# Patient Record
Sex: Female | Born: 1970 | Race: White | Hispanic: No | Marital: Married | Smoking: Never smoker
Health system: Southern US, Community
[De-identification: ages and names within clinical notes are randomized; demographics above are authoritative.]

## PROBLEM LIST (undated history)

## (undated) DIAGNOSIS — I10 Essential (primary) hypertension: Secondary | ICD-10-CM

## (undated) DIAGNOSIS — E039 Hypothyroidism, unspecified: Secondary | ICD-10-CM

## (undated) DIAGNOSIS — Z973 Presence of spectacles and contact lenses: Secondary | ICD-10-CM

## (undated) DIAGNOSIS — F329 Major depressive disorder, single episode, unspecified: Secondary | ICD-10-CM

## (undated) DIAGNOSIS — R102 Pelvic and perineal pain: Secondary | ICD-10-CM

## (undated) DIAGNOSIS — J4520 Mild intermittent asthma, uncomplicated: Secondary | ICD-10-CM

## (undated) DIAGNOSIS — R112 Nausea with vomiting, unspecified: Secondary | ICD-10-CM

## (undated) DIAGNOSIS — K219 Gastro-esophageal reflux disease without esophagitis: Secondary | ICD-10-CM

## (undated) DIAGNOSIS — F32A Depression, unspecified: Secondary | ICD-10-CM

## (undated) DIAGNOSIS — R011 Cardiac murmur, unspecified: Secondary | ICD-10-CM

## (undated) DIAGNOSIS — Z9889 Other specified postprocedural states: Secondary | ICD-10-CM

## (undated) HISTORY — DX: Depression, unspecified: F32.A

## (undated) HISTORY — DX: Major depressive disorder, single episode, unspecified: F32.9

## (undated) HISTORY — PX: ABDOMINAL HYSTERECTOMY: SHX81

## (undated) HISTORY — PX: BACK SURGERY: SHX140

## (undated) HISTORY — PX: AUGMENTATION MAMMAPLASTY: SUR837

## (undated) HISTORY — PX: BREAST SURGERY: SHX581

---

## 1996-12-15 HISTORY — PX: TUBAL LIGATION: SHX77

## 1998-08-01 ENCOUNTER — Other Ambulatory Visit: Admission: RE | Admit: 1998-08-01 | Discharge: 1998-08-01 | Payer: Self-pay | Admitting: Obstetrics and Gynecology

## 1999-09-18 ENCOUNTER — Other Ambulatory Visit: Admission: RE | Admit: 1999-09-18 | Discharge: 1999-09-18 | Payer: Self-pay | Admitting: Obstetrics and Gynecology

## 2000-10-16 ENCOUNTER — Other Ambulatory Visit: Admission: RE | Admit: 2000-10-16 | Discharge: 2000-10-16 | Payer: Self-pay | Admitting: Obstetrics and Gynecology

## 2002-06-03 ENCOUNTER — Other Ambulatory Visit: Admission: RE | Admit: 2002-06-03 | Discharge: 2002-06-03 | Payer: Self-pay | Admitting: Obstetrics and Gynecology

## 2004-01-12 ENCOUNTER — Other Ambulatory Visit: Admission: RE | Admit: 2004-01-12 | Discharge: 2004-01-12 | Payer: Self-pay | Admitting: Obstetrics and Gynecology

## 2005-09-24 ENCOUNTER — Other Ambulatory Visit: Admission: RE | Admit: 2005-09-24 | Discharge: 2005-09-24 | Payer: Self-pay | Admitting: Obstetrics and Gynecology

## 2006-09-28 ENCOUNTER — Ambulatory Visit (HOSPITAL_COMMUNITY): Admission: RE | Admit: 2006-09-28 | Discharge: 2006-09-28 | Payer: Self-pay | Admitting: General Surgery

## 2006-09-28 ENCOUNTER — Encounter (INDEPENDENT_AMBULATORY_CARE_PROVIDER_SITE_OTHER): Payer: Self-pay | Admitting: Specialist

## 2006-09-28 HISTORY — PX: EXCISIONAL HEMORRHOIDECTOMY: SHX1541

## 2006-12-15 HISTORY — PX: BREAST ENHANCEMENT SURGERY: SHX7

## 2007-07-30 ENCOUNTER — Emergency Department: Payer: Self-pay | Admitting: Emergency Medicine

## 2007-12-16 HISTORY — PX: MICROTUBOPLASTY: SHX5401

## 2008-05-29 ENCOUNTER — Ambulatory Visit: Payer: Self-pay

## 2008-06-14 ENCOUNTER — Ambulatory Visit: Payer: Self-pay | Admitting: Pain Medicine

## 2008-12-05 ENCOUNTER — Ambulatory Visit (HOSPITAL_COMMUNITY): Admission: RE | Admit: 2008-12-05 | Discharge: 2008-12-06 | Payer: Self-pay | Admitting: Neurosurgery

## 2008-12-05 HISTORY — PX: LUMBAR LAMINECTOMY: SHX95

## 2009-02-26 ENCOUNTER — Ambulatory Visit (HOSPITAL_COMMUNITY): Admission: RE | Admit: 2009-02-26 | Discharge: 2009-02-26 | Payer: Self-pay | Admitting: Obstetrics and Gynecology

## 2009-04-25 ENCOUNTER — Inpatient Hospital Stay (HOSPITAL_COMMUNITY): Admission: AD | Admit: 2009-04-25 | Discharge: 2009-04-25 | Payer: Self-pay | Admitting: Obstetrics and Gynecology

## 2009-06-20 ENCOUNTER — Inpatient Hospital Stay (HOSPITAL_COMMUNITY): Admission: AD | Admit: 2009-06-20 | Discharge: 2009-06-20 | Payer: Self-pay | Admitting: Obstetrics and Gynecology

## 2010-02-11 ENCOUNTER — Encounter: Admission: RE | Admit: 2010-02-11 | Discharge: 2010-02-11 | Payer: Self-pay | Admitting: Obstetrics and Gynecology

## 2010-02-28 ENCOUNTER — Encounter: Admission: RE | Admit: 2010-02-28 | Discharge: 2010-02-28 | Payer: Self-pay | Admitting: Obstetrics and Gynecology

## 2010-03-08 ENCOUNTER — Encounter: Admission: RE | Admit: 2010-03-08 | Discharge: 2010-03-08 | Payer: Self-pay | Admitting: Obstetrics and Gynecology

## 2010-07-19 ENCOUNTER — Encounter: Admission: RE | Admit: 2010-07-19 | Discharge: 2010-07-19 | Payer: Self-pay | Admitting: Orthopedic Surgery

## 2011-01-06 ENCOUNTER — Encounter
Admission: RE | Admit: 2011-01-06 | Discharge: 2011-01-06 | Payer: Self-pay | Source: Home / Self Care | Attending: Obstetrics and Gynecology | Admitting: Obstetrics and Gynecology

## 2011-04-29 NOTE — Op Note (Signed)
NAMEAERON, Vanessa Chapman NO.:  [id number]   MEDICAL RECORD NO.:  [id number]          PATIENT TYPE:  OIB   LOCATION:  3534                         FACILITY:  [facility]   PHYSICIAN:  Hilda Lias, M.D.   DATE OF BIRTH:  Feb 14, 1971   DATE OF PROCEDURE:  12/05/2008  DATE OF DISCHARGE:                               OPERATIVE REPORT   PREOPERATIVE DIAGNOSIS:  Right L4-5 herniated disk with free fragment.   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   PROCEDURE:  Right L4 hemilaminectomy, right L4-5 foraminotomy,  decompression of the L4-L5 nerve root, microscopy.   SURGEON:  Hilda Lias, MD   ASSISTANT:  Danae Orleans. Venetia Maxon, MD   CLINICAL HISTORY:  [name] is a 40 year old female who about 3 years  ago had sudden onset of back pain.  Back in May 2009, she developed pain  down to the right hip.  The patient had quite a bit of discomfort and  she has failed conservative treatment.  MRI done in June 2009 showed  fragment of L4-5.  The patient wants to proceed with surgery.  Risk was  explained to her.   PROCEDURE:  The patient was taken to the OR and after intubation, she  was positioned in the prone manner.  The first x-rays showed the lamina  was in the level of L5-S1.  From then on, after we cleaned the skin with  DuraPrep, we made an incision from L4-L5.  We dissected the mass in the  right side.  Second x-ray was obtained and we proceed with a laminotomy  at L4-5.  The patient had quite a bit vertical canal.  With the  microscope, we removed a yellow ligament.  We investigated the thecal  sac and we went laterally to investigate the L4 and then later on the L5  nerve root.  The L4-5 disk was quite flat.  There was no evidence of any  herniated disk.  We did a foraminotomy because of stenosis of L5 nerve  root and after that we were able to retract the takeoff of the L5 nerve  root.  The patient had a small herniated disk which was flat, but  mostly  was adherent to the L5 nerve root.  With the microcurette, we did the  lysis of adhesions and we tried to open the area where the adhesion was,  but there was no evidence of any fragment.  Because the disk was  completely normal, we did not proceed with any diskectomy.  Then, we  investigated the L4 nerve root canal and it was wide open and  foraminotomy was accomplished.  At the end, we had plenty of room.  Valsalva maneuver was negative.  Then, the area was irrigated.  Fentanyl  and Depo-Medrol were left in the epidural space and the wound was closed  with Vicryl and Steri-Strips.           ______________________________  Hilda Lias, M.D.     EB/MEDQ  D:  12/05/2008  T:  12/05/2008  Job:  [id number]

## 2011-05-02 NOTE — Op Note (Signed)
NAME:  Vanessa Chapman, Vanessa Chapman                  ACCOUNT NO.:  [id number]   MEDICAL RECORD NO.:  [id number]          PATIENT TYPE:  AMB   LOCATION:  DAY                          FACILITY:  [facility]   PHYSICIAN:  Ollen Gross. Vernell Morgans, M.D. DATE OF BIRTH:  10-25-1971   DATE OF PROCEDURE:  09/28/2006  DATE OF DISCHARGE:                               OPERATIVE REPORT   PREOPERATIVE DIAGNOSIS:  External hemorrhoids.   POSTOPERATIVE DIAGNOSIS:  External hemorrhoids.   PROCEDURE:  External hemorrhoidectomy x2.   SURGEON:  Ollen Gross. Carolynne Edouard, M.D.   ANESTHESIA:  General via LMA.   PROCEDURE:  After informed consent was obtained, the patient was brought  to the operating and placed in the supine position on the operating  table.  After adequate induction of general anesthesia, the patient was  placed in lithotomy position.  Her perirectal area was prepped with  Betadine and draped in the usual sterile manner.  The patient had a  anterior external hemorrhoid and a right posterior external hemorrhoid  that were chronically thrombosing and causing her significant problems.  She had small hemorrhoidal  extra skin in each of these areas.  The  perirectal region was then infiltrated with 9 cc of 0.25% Marcaine with  1 cc Wydase, and the tissue was massaged gently for several minutes.  The perirectal region was then infiltrated with 10 more cc of 0.25%  Marcaine with epinephrine.  Again, the tissue was massaged for several  minutes.  A small bullet retractor was then placed inside the rectum.  Other than the two areas of external hemorrhoidal skin, there were no  other abnormalities noted.  Each of these areas was grasped with a Allis  clamp and elevated. The base of the extra hemorrhoidal skin was then  incised with a 15 blade knife.  A hemostat was then placed across the  base of this hemorrhoidal skin and clamped. The external hemorrhoid was  then removed with Metzenbaum scissors, and the incision was then  reapproximated with a running 3-0 chromic stitch.  The last couple  millimeters of each of these incisions was left open for drainage.  Once  this was accomplished, each of the incisions was found to be hemostatic  and well approximated.  A small piece of Gelfoam with dibucaine ointment  on it was placed inside the rectum, and dibucaine ointment was applied  externally to the incisions and open wounds.  Sterile dressings were  then applied.  The patient tolerated the procedure well.  At the end of  the case, all needle, sponge, and instrument counts were correct.  The  patient was then awakened and taken to the recovery room in stable  condition.      Ollen Gross. Vernell Morgans, M.D.  Electronically Signed     PST/MEDQ  D:  09/28/2006  T:  09/28/2006  Job:  [id number]

## 2011-09-19 LAB — CBC
HCT: 39.5 % (ref 36.0–46.0)
Platelets: 250 10*3/uL (ref 150–400)
RBC: 4.19 MIL/uL (ref 3.87–5.11)
RDW: 13 % (ref 11.5–15.5)

## 2011-12-05 ENCOUNTER — Other Ambulatory Visit: Payer: Self-pay | Admitting: Obstetrics and Gynecology

## 2011-12-05 DIAGNOSIS — Z1231 Encounter for screening mammogram for malignant neoplasm of breast: Secondary | ICD-10-CM

## 2011-12-24 ENCOUNTER — Other Ambulatory Visit: Payer: Self-pay | Admitting: Obstetrics and Gynecology

## 2011-12-24 ENCOUNTER — Ambulatory Visit
Admission: RE | Admit: 2011-12-24 | Discharge: 2011-12-24 | Disposition: A | Discharge status: Home / Self Care | Payer: Self-pay | Source: Ambulatory Visit | Attending: Obstetrics and Gynecology | Admitting: Obstetrics and Gynecology

## 2011-12-24 DIAGNOSIS — Z1231 Encounter for screening mammogram for malignant neoplasm of breast: Secondary | ICD-10-CM

## 2013-09-13 ENCOUNTER — Other Ambulatory Visit: Payer: Self-pay | Admitting: Internal Medicine

## 2013-09-13 DIAGNOSIS — R1013 Epigastric pain: Secondary | ICD-10-CM

## 2013-09-16 ENCOUNTER — Ambulatory Visit: Payer: Self-pay | Admitting: Internal Medicine

## 2013-09-16 LAB — HCG, QUANTITATIVE, PREGNANCY: Beta Hcg, Quant.: <1 m[IU]/mL — ABNORMAL LOW

## 2013-09-23 ENCOUNTER — Other Ambulatory Visit: Payer: BC Managed Care – PPO

## 2013-09-26 ENCOUNTER — Ambulatory Visit
Admission: RE | Admit: 2013-09-26 | Discharge: 2013-09-26 | Disposition: A | Discharge status: Home / Self Care | Payer: BC Managed Care – PPO | Source: Ambulatory Visit | Attending: Internal Medicine | Admitting: Internal Medicine

## 2013-09-26 DIAGNOSIS — R1013 Epigastric pain: Secondary | ICD-10-CM

## 2013-10-12 ENCOUNTER — Ambulatory Visit: Payer: Self-pay | Admitting: Internal Medicine

## 2013-10-20 ENCOUNTER — Ambulatory Visit: Payer: Self-pay | Admitting: Internal Medicine

## 2014-01-26 ENCOUNTER — Ambulatory Visit: Payer: Self-pay | Admitting: Gastroenterology

## 2014-02-14 ENCOUNTER — Other Ambulatory Visit: Payer: Self-pay

## 2014-02-14 DIAGNOSIS — Z1231 Encounter for screening mammogram for malignant neoplasm of breast: Secondary | ICD-10-CM

## 2014-03-09 ENCOUNTER — Ambulatory Visit
Admission: RE | Admit: 2014-03-09 | Discharge: 2014-03-09 | Disposition: A | Discharge status: Home / Self Care | Payer: Self-pay | Source: Ambulatory Visit

## 2014-03-09 DIAGNOSIS — Z1231 Encounter for screening mammogram for malignant neoplasm of breast: Secondary | ICD-10-CM

## 2015-01-04 ENCOUNTER — Ambulatory Visit: Payer: Self-pay | Admitting: Endocrinology

## 2015-01-10 ENCOUNTER — Other Ambulatory Visit: Payer: Self-pay | Admitting: Obstetrics and Gynecology

## 2015-01-11 LAB — CYTOLOGY - PAP

## 2015-01-15 HISTORY — PX: ENDOMETRIAL ABLATION W/ NOVASURE: SUR434

## 2015-03-06 ENCOUNTER — Other Ambulatory Visit: Payer: Self-pay | Admitting: Obstetrics and Gynecology

## 2015-06-02 ENCOUNTER — Other Ambulatory Visit: Payer: Self-pay | Admitting: Urgent Care

## 2015-06-02 DIAGNOSIS — K219 Gastro-esophageal reflux disease without esophagitis: Secondary | ICD-10-CM

## 2015-10-16 HISTORY — PX: BALLOON SINUPLASTY: SHX5740

## 2015-11-01 ENCOUNTER — Emergency Department
Admission: EM | Admit: 2015-11-01 | Discharge: 2015-11-02 | Disposition: A | Discharge status: Home / Self Care | Payer: BLUE CROSS/BLUE SHIELD | Attending: Student | Admitting: Student

## 2015-11-01 ENCOUNTER — Other Ambulatory Visit: Payer: Self-pay

## 2015-11-01 DIAGNOSIS — Z3202 Encounter for pregnancy test, result negative: Secondary | ICD-10-CM | POA: Insufficient documentation

## 2015-11-01 DIAGNOSIS — R112 Nausea with vomiting, unspecified: Secondary | ICD-10-CM | POA: Insufficient documentation

## 2015-11-01 DIAGNOSIS — R001 Bradycardia, unspecified: Secondary | ICD-10-CM | POA: Insufficient documentation

## 2015-11-01 DIAGNOSIS — Z880 Allergy status to penicillin: Secondary | ICD-10-CM | POA: Insufficient documentation

## 2015-11-01 DIAGNOSIS — G8918 Other acute postprocedural pain: Secondary | ICD-10-CM | POA: Diagnosis present

## 2015-11-01 DIAGNOSIS — Z79899 Other long term (current) drug therapy: Secondary | ICD-10-CM | POA: Insufficient documentation

## 2015-11-01 DIAGNOSIS — R1013 Epigastric pain: Secondary | ICD-10-CM | POA: Diagnosis not present

## 2015-11-01 LAB — COMPREHENSIVE METABOLIC PANEL
ALT: 12 U/L — ABNORMAL LOW (ref 14–54)
AST: 17 U/L (ref 15–41)
Albumin: 4.6 g/dL (ref 3.5–5.0)
Alkaline Phosphatase: 52 U/L (ref 38–126)
Anion gap: 5 (ref 5–15)
BILIRUBIN TOTAL: 0.6 mg/dL (ref 0.3–1.2)
BUN: 14 mg/dL (ref 6–20)
CHLORIDE: 105 mmol/L (ref 101–111)
CO2: 32 mmol/L (ref 22–32)
CREATININE: 0.83 mg/dL (ref 0.44–1.00)
Calcium: 9.2 mg/dL (ref 8.9–10.3)
Glucose, Bld: 115 mg/dL — ABNORMAL HIGH (ref 65–99)
POTASSIUM: 4 mmol/L (ref 3.5–5.1)
Sodium: 142 mmol/L (ref 135–145)
TOTAL PROTEIN: 7.3 g/dL (ref 6.5–8.1)

## 2015-11-01 LAB — URINALYSIS COMPLETE WITH MICROSCOPIC (ARMC ONLY)
BILIRUBIN URINE: NEGATIVE
Glucose, UA: NEGATIVE mg/dL
Hgb urine dipstick: NEGATIVE
KETONES UR: NEGATIVE mg/dL
Leukocytes, UA: NEGATIVE
NITRITE: NEGATIVE
Protein, ur: NEGATIVE mg/dL
SPECIFIC GRAVITY, URINE: 1.01 (ref 1.005–1.030)
pH: 7 (ref 5.0–8.0)

## 2015-11-01 LAB — PREGNANCY, URINE: PREG TEST UR: NEGATIVE

## 2015-11-01 LAB — CBC
HEMATOCRIT: 38.8 % (ref 35.0–47.0)
Hemoglobin: 12.5 g/dL (ref 12.0–16.0)
MCH: 29.6 pg (ref 26.0–34.0)
MCHC: 32.3 g/dL (ref 32.0–36.0)
MCV: 91.6 fL (ref 80.0–100.0)
PLATELETS: 276 10*3/uL (ref 150–440)
RBC: 4.24 MIL/uL (ref 3.80–5.20)
RDW: 13.9 % (ref 11.5–14.5)
WBC: 16 10*3/uL — AB (ref 3.6–11.0)

## 2015-11-01 LAB — LIPASE, BLOOD: Lipase: 34 U/L (ref 11–51)

## 2015-11-01 MED ORDER — SODIUM CHLORIDE 0.9 % IV BOLUS (SEPSIS)
1000.0000 mL | Freq: Once | INTRAVENOUS | Status: AC
  Administered 2015-11-01: 1000 mL via INTRAVENOUS

## 2015-11-01 MED ORDER — FAMOTIDINE IN NACL 20-0.9 MG/50ML-% IV SOLN
20.0000 mg | Freq: Once | INTRAVENOUS | Status: AC
  Administered 2015-11-01: 20 mg via INTRAVENOUS
  Filled 2015-11-01: qty 50

## 2015-11-01 MED ORDER — OMEPRAZOLE MAGNESIUM 20 MG PO TBEC: 20.0000 mg | DELAYED_RELEASE_TABLET | Freq: Every day | ORAL | Status: DC

## 2015-11-01 MED ORDER — OXYCODONE HCL 5 MG PO TABS: 5.0000 mg | ORAL_TABLET | Freq: Four times a day (QID) | ORAL | Status: DC | PRN

## 2015-11-01 MED ORDER — ONDANSETRON HCL 4 MG/2ML IJ SOLN
4.0000 mg | Freq: Once | INTRAMUSCULAR | Status: AC
  Administered 2015-11-01: 4 mg via INTRAVENOUS
  Filled 2015-11-01: qty 2

## 2015-11-01 MED ORDER — OXYCODONE HCL 5 MG PO TABS
10.0000 mg | ORAL_TABLET | Freq: Once | ORAL | Status: AC
  Administered 2015-11-01: 10 mg via ORAL
  Filled 2015-11-01: qty 2

## 2015-11-01 NOTE — ED Provider Notes (Signed)
[facility] Emergency Department Provider Note  ____________________________________________  Time seen: Approximately 9:21 PM  I have reviewed the triage vital signs and the nursing notes.   HISTORY  Chief Complaint Post-op Problem    HPI Vanessa SchwalbeKaren Mascaro is a 44 y.o. female POD#1 s/p sinuplasty yesterday formed by Dr. Colbert EwingYuengel presents for evaluation of one day for gastric soreness and bilious emesis, gradual onset, constant since onset, currently moderate to severe. The patient reports that she was prescribed Norco which contains acetaminophen and she has had upset stomach in the setting of taking acetaminophen in the past. Denies Any fevers or chills. No diarrhea. No chest pain or difficulty breathing. Reports she is healing well from her surgery.   No past medical history on file.  There are no active problems to display for this patient.   No past surgical history on file.  Current Outpatient Rx  Name  Route  Sig  Dispense  Refill  . DEXILANT 60 MG capsule      take 1 capsule by mouth once daily   30 capsule   11     SAVINGS FOR NON-COVERED DRUGS -- [id number], PCN:  ...   . omeprazole (PRILOSEC OTC) 20 MG tablet   Oral   Take 1 tablet (20 mg total) by mouth daily.   30 tablet   0   . oxyCODONE (ROXICODONE) 5 MG immediate release tablet   Oral   Take 1 tablet (5 mg total) by mouth every 6 (six) hours as needed for moderate pain (Do not take norco while taking this medication). Do not drive while taking this medication.   12 tablet   0     Allergies Penicillins  No family history on file.  Social History Social History  Substance Use Topics  . Smoking status: Not on file  . Smokeless tobacco: Not on file  . Alcohol Use: Not on file    Review of Systems Constitutional: No fever/chills Eyes: No visual changes. ENT: No sore throat. Cardiovascular: Denies chest pain. Respiratory: Denies shortness of breath. Gastrointestinal: +  abdominal pain.  + nausea, +vomiting.  No diarrhea.  No constipation. Genitourinary: Negative for dysuria. Musculoskeletal: Negative for back pain. Skin: Negative for rash. Neurological: Negative for headaches, focal weakness or numbness.  10-point ROS otherwise negative.  ____________________________________________   PHYSICAL EXAM:  VITAL SIGNS: ED Triage Vitals  Enc Vitals Group     BP 11/01/15 1955 163/92 mmHg     Pulse Rate 11/01/15 1953 60     Resp 11/01/15 1953 18     Temp 11/01/15 1953 98.1 F (36.7 C)     Temp Source 11/01/15 1953 Oral     SpO2 11/01/15 1953 100 %     Weight 11/01/15 1953 146 lb (66.225 kg)     Height 11/01/15 1953 5\' 3"  (1.6 m)     Head Cir --      Peak Flow --      Pain Score 11/01/15 1954 9     Pain Loc --      Pain Edu? --      Excl. in GC? --     Constitutional: Alert and oriented. Well appearing and in no acute distress. Eyes: Conjunctivae are normal. PERRL. EOMI. Head: Atraumatic. Nose: No congestion/rhinnorhea. Mouth/Throat: Mucous membranes are moist.  Oropharynx non-erythematous. Neck: No stridor.  Cardiovascular: mildly bradycardic rate, regular rhythm. Grossly normal heart sounds.  Good peripheral circulation. Respiratory: Normal respiratory effort.  No retractions. Lungs CTAB. Gastrointestinal: Soft  and nontender. No distention.  No CVA tenderness. Genitourinary: deferred Musculoskeletal: No lower extremity tenderness nor edema.  No joint effusions. Neurologic:  Normal speech and language. No gross focal neurologic deficits are appreciated. No gait instability. Skin:  Skin is warm, dry and intact. No rash noted. Psychiatric: Mood and affect are normal. Speech and behavior are normal.  ____________________________________________   LABS (all labs ordered are listed, but only abnormal results are displayed)  Labs Reviewed  CBC - Abnormal; Notable for the following:    WBC 16.0 (*)    All other components within normal limits   COMPREHENSIVE METABOLIC PANEL - Abnormal; Notable for the following:    Glucose, Bld 115 (*)    ALT 12 (*)    All other components within normal limits  URINALYSIS COMPLETEWITH MICROSCOPIC ([facility] ONLY) - Abnormal; Notable for the following:    Color, Urine YELLOW (*)    APPearance CLEAR (*)    Bacteria, UA RARE (*)    Squamous Epithelial / LPF 6-30 (*)    All other components within normal limits  LIPASE, BLOOD  PREGNANCY, URINE  URINALYSIS COMPLETEWITH MICROSCOPIC ([facility] ONLY)   ____________________________________________  EKG  ED ECG REPORT I, Gayla Doss, the attending physician, personally viewed and interpreted this ECG.   Date: 11/01/2015  EKG Time: 23:36  Rate: 48  Rhythm: sinus bradycardia  Axis: normal  Intervals:none  ST&T Change: No acute ST elevation  ____________________________________________  RADIOLOGY  none ____________________________________________   PROCEDURES  Procedure(s) performed: None  Critical Care performed: No  ____________________________________________   INITIAL IMPRESSION / ASSESSMENT AND PLAN / ED COURSE  Pertinent labs & imaging results that were available during my care of the patient were reviewed by me and considered in my medical decision making (see chart for details).  Vanessa Chapman is a 44 y.o. female POD#1 s/p sinuplasty yesterday formed by Dr. Elenore Rota presents for evaluation of one day for gastric soreness and bilious emesis, gradual onset, constant since onset, currently moderate to severe. On exam, she is nontoxic appearing and in no acute distress. Vital signs stable, she is afebrile. She has a completely benign abdominal exam without tenderness. Normal bowel sounds. Passing Flatus. Labs reviewed are notable for leukocytosis which is likely catecholamine- induced in the setting of vomiting. Normal CMP, normal lipase negative pregnancy test, urinalysis not consistent with infection. Given her benign exam, the fact  that she is tolerating by mouth intake here in the emergency department, I do not think she needs advanced imaging at this time. She feels better after Zofran and IV fluids. Discussed that we would discharge her with Roxicodone which does not contain acetaminophen and Pepcid that will control her pain and not upset her stomach. Case discussed with Dr. Elenore Rota as well. DC with return precautions. ____________________________________________   FINAL CLINICAL IMPRESSION(S) / ED DIAGNOSES  Final diagnoses:  Epigastric pain  Non-intractable vomiting with nausea, vomiting of unspecified type      Gayla Doss, MD 11/01/15 2350

## 2015-11-01 NOTE — ED Notes (Signed)
Pt in with co vomiting and abd pain had sinus surgery yest.  Has been taking norco since yest, not able to keep food or fluids down.

## 2016-03-12 ENCOUNTER — Telehealth: Payer: Self-pay | Admitting: Gastroenterology

## 2016-03-12 NOTE — Telephone Encounter (Signed)
Left pt a message stating unfortunately I do not have an earlier appt but after review her previous notes from 2015 she will be okay to wait until that time. Advised her if symptoms worsen, she can contact her primary care and explain the situation. I have also added her to our wait list in case someone cancels.

## 2016-03-12 NOTE — Telephone Encounter (Signed)
Vanessa Chapman, this is a patient of Dr Servando SnareWohl. She last saw him in 2015. Her symptoms have returned. She is having constant abdominal pain. Dexilant doesn't help. I have scheduled her an appointment for May 4th. She would like to speak with you to make sure its okay to wait that long. Please call and advise. Thank you.

## 2016-04-17 ENCOUNTER — Ambulatory Visit: Payer: Self-pay | Admitting: Gastroenterology

## 2016-10-29 NOTE — H&P (Signed)
Vanessa SchwalbeKaren Chapman  DICTATION # [id number] CSN# [id number]   Vanessa PicaHOLLAND,Vanessa Beedy M, MD 10/29/2016 10:45 AM

## 2016-10-29 NOTE — H&P (Signed)
NAMRennis Petty:  Vanessa Chapman, Vanessa Chapman                ACCOUNT NO.:  [id number]  MEDICAL RECORD NO.:  [id number]  LOCATION:                                 FACILITY:  PHYSICIAN:  Duke Salviaichard M. Marcelle OverlieHolland, M.D.DATE OF BIRTH:  03-11-1971  DATE OF ADMISSION: DATE OF DISCHARGE:                             HISTORY & PHYSICAL   CHIEF COMPLAINT:  Pelvic pain, adenomyosis.  HISTORY OF PRESENT ILLNESS:  A 45 year old, G2, P2, who has had a prior endometrial ablation that was in April of 2016.  Her bleeding has done generally well since that time, but she continues to have significant pelvic pain, not relieved by OTC medications.  Ultrasound evaluation August 17 showed a small intramural fibroid, areas that looked cystic within the myometrium consistent with adenomyosis.  Adnexa negative. She presents at this time for LAVH and bilateral salpingectomy.  This procedure including specific risks related to bleeding, infection, transfusion, adjacent organ injury, wound infection, phlebitis, her expected recovery time, and the possible need to complete the surgery by open technique, all reviewed with her which she understands and accepts.  PAST MEDICAL HISTORY:  ALLERGIES: 1. PENICILLIN. 2. SINGULAIR.  CURRENT MEDICATIONS:  Synthroid, metoprolol, Xanax p.r.n., Vicodin as needed for pain.  SURGICAL HISTORY:  She has had 1 vaginal delivery, 1 C-section, tubal reversal in 2009, breast augmentation in 2008, hemorrhoidectomy and NovaSure ablation in February, 2016.  FAMILY HISTORY:  Otherwise negative.  SOCIAL HISTORY:  Denies drug or tobacco use.  Drinks alcohol socially. She is married.  Dr. Sharin GraveSam Morayati is her PCP.  Last Pap on September 17 was normal.  PHYSICAL EXAMINATION:  VITAL SIGNS:  Temp 98.2, blood pressure 134/88. HEENT:  Unremarkable. NECK:  Supple without masses. LUNGS:  Clear. CARDIOVASCULAR:  Regular rate and rhythm without murmurs, rubs, or gallops noted. BREASTS:  Revealed implants.  No  masses. ABDOMEN:  Soft, flat, nontender.  Vulva, vagina, cervix normal.  Uterus upper limits of normal size, mobile.  Adnexa negative. EXTREMITIES:  Unremarkable. NEUROLOGIC:  Unremarkable.  IMPRESSION:  Pelvic pain, probably secondary to adenomyosis, prior ablation.  PLAN:  LAVH and bilateral salpingectomy.  Procedure and risks reviewed as above.     Samira Acero M. Marcelle OverlieHolland, M.D.     [facility]/MEDQ  D:  10/29/2016  T:  10/29/2016  Job:  [id number]

## 2016-11-13 ENCOUNTER — Encounter (HOSPITAL_BASED_OUTPATIENT_CLINIC_OR_DEPARTMENT_OTHER): Payer: Self-pay | Admitting: *Deleted

## 2016-11-14 ENCOUNTER — Encounter (HOSPITAL_BASED_OUTPATIENT_CLINIC_OR_DEPARTMENT_OTHER): Payer: Self-pay | Admitting: *Deleted

## 2016-11-14 NOTE — Progress Notes (Signed)
NPO AFTER MN.  ARRIVE AT 0600.  NEEDS URINE PREG. AND T&S.  GETTING LAB WORK DONE Monday 11-17-2016 (CBC, BMET).  CURRENT EKG IN CHART AND EPIC.  WILL TAKE AM MEDS AND ACID REDUCER DOS W/ SIPS OF WATER.

## 2016-11-17 LAB — CBC
HCT: 40.5 % (ref 36.0–46.0)
Hemoglobin: 13.4 g/dL (ref 12.0–15.0)
MCH: 31.8 pg (ref 26.0–34.0)
MCHC: 33.1 g/dL (ref 30.0–36.0)
MCV: 96 fL (ref 78.0–100.0)
PLATELETS: 239 10*3/uL (ref 150–400)
RBC: 4.22 MIL/uL (ref 3.87–5.11)
RDW: 12.8 % (ref 11.5–15.5)
WBC: 6.1 10*3/uL (ref 4.0–10.5)

## 2016-11-17 LAB — TYPE AND SCREEN
ABO/RH(D): A POS
Antibody Screen: NEGATIVE

## 2016-11-17 LAB — ABO/RH: ABO/RH(D): A POS

## 2016-11-18 ENCOUNTER — Inpatient Hospital Stay (HOSPITAL_BASED_OUTPATIENT_CLINIC_OR_DEPARTMENT_OTHER)
Admission: AD | Admit: 2016-11-18 | Discharge: 2016-11-20 | DRG: 743 | Disposition: A | Discharge status: Home / Self Care | Payer: BLUE CROSS/BLUE SHIELD | Source: Ambulatory Visit | Attending: Obstetrics and Gynecology | Admitting: Obstetrics and Gynecology

## 2016-11-18 ENCOUNTER — Ambulatory Visit (HOSPITAL_BASED_OUTPATIENT_CLINIC_OR_DEPARTMENT_OTHER): Payer: BLUE CROSS/BLUE SHIELD | Admitting: Anesthesiology

## 2016-11-18 ENCOUNTER — Encounter (HOSPITAL_COMMUNITY)
Admission: AD | Disposition: A | Discharge status: Home / Self Care | Payer: Self-pay | Source: Ambulatory Visit | Attending: Obstetrics and Gynecology

## 2016-11-18 ENCOUNTER — Encounter (HOSPITAL_BASED_OUTPATIENT_CLINIC_OR_DEPARTMENT_OTHER): Payer: Self-pay | Admitting: *Deleted

## 2016-11-18 DIAGNOSIS — N800 Endometriosis of uterus: Secondary | ICD-10-CM | POA: Diagnosis present

## 2016-11-18 DIAGNOSIS — N856 Intrauterine synechiae: Secondary | ICD-10-CM | POA: Diagnosis present

## 2016-11-18 DIAGNOSIS — E039 Hypothyroidism, unspecified: Secondary | ICD-10-CM | POA: Diagnosis present

## 2016-11-18 DIAGNOSIS — R102 Pelvic and perineal pain: Secondary | ICD-10-CM | POA: Diagnosis present

## 2016-11-18 DIAGNOSIS — Z419 Encounter for procedure for purposes other than remedying health state, unspecified: Secondary | ICD-10-CM

## 2016-11-18 HISTORY — DX: Hypothyroidism, unspecified: E03.9

## 2016-11-18 HISTORY — DX: Mild intermittent asthma, uncomplicated: J45.20

## 2016-11-18 HISTORY — DX: Nausea with vomiting, unspecified: R11.2

## 2016-11-18 HISTORY — DX: Pelvic and perineal pain: R10.2

## 2016-11-18 HISTORY — DX: Nausea with vomiting, unspecified: Z98.890

## 2016-11-18 HISTORY — DX: Presence of spectacles and contact lenses: Z97.3

## 2016-11-18 HISTORY — DX: Gastro-esophageal reflux disease without esophagitis: K21.9

## 2016-11-18 HISTORY — DX: Essential (primary) hypertension: I10

## 2016-11-18 HISTORY — PX: LAPAROSCOPIC VAGINAL HYSTERECTOMY WITH SALPINGECTOMY: SHX6680

## 2016-11-18 HISTORY — DX: Cardiac murmur, unspecified: R01.1

## 2016-11-18 LAB — POCT I-STAT, CHEM 8
BUN: 13 mg/dL (ref 6–20)
CALCIUM ION: 1.22 mmol/L (ref 1.15–1.40)
CREATININE: 0.9 mg/dL (ref 0.44–1.00)
Chloride: 102 mmol/L (ref 101–111)
GLUCOSE: 95 mg/dL (ref 65–99)
HCT: 40 % (ref 36.0–46.0)
Hemoglobin: 13.6 g/dL (ref 12.0–15.0)
Potassium: 3.7 mmol/L (ref 3.5–5.1)
Sodium: 143 mmol/L (ref 135–145)
TCO2: 27 mmol/L (ref 0–100)

## 2016-11-18 SURGERY — HYSTERECTOMY, VAGINAL, LAPAROSCOPY-ASSISTED, WITH SALPINGECTOMY
Anesthesia: General | Laterality: Bilateral

## 2016-11-18 MED ORDER — ONDANSETRON HCL 4 MG/2ML IJ SOLN: 4.0000 mg | Freq: Four times a day (QID) | INTRAMUSCULAR | Status: DC | PRN

## 2016-11-18 MED ORDER — MORPHINE SULFATE 2 MG/ML IV SOLN
INTRAVENOUS | Status: DC
  Administered 2016-11-18: 14:00:00 via INTRAVENOUS
  Administered 2016-11-18: 4 mg via INTRAVENOUS
  Administered 2016-11-19: 10 mg via INTRAVENOUS
  Filled 2016-11-18: qty 25

## 2016-11-18 MED ORDER — ONDANSETRON HCL 4 MG PO TABS: 4.0000 mg | ORAL_TABLET | Freq: Four times a day (QID) | ORAL | Status: DC | PRN

## 2016-11-18 MED ORDER — FENTANYL CITRATE (PF) 100 MCG/2ML IJ SOLN
INTRAMUSCULAR | Status: DC | PRN
  Administered 2016-11-18: 50 ug via INTRAVENOUS
  Administered 2016-11-18 (×2): 25 ug via INTRAVENOUS

## 2016-11-18 MED ORDER — DIPHENHYDRAMINE HCL 12.5 MG/5ML PO ELIX: 12.5000 mg | ORAL_SOLUTION | Freq: Four times a day (QID) | ORAL | Status: DC | PRN

## 2016-11-18 MED ORDER — ONDANSETRON HCL 4 MG/2ML IJ SOLN
INTRAMUSCULAR | Status: AC
  Filled 2016-11-18: qty 2

## 2016-11-18 MED ORDER — PROPOFOL 10 MG/ML IV BOLUS
INTRAVENOUS | Status: AC
  Filled 2016-11-18: qty 20

## 2016-11-18 MED ORDER — GLYCOPYRROLATE 0.2 MG/ML IV SOSY
PREFILLED_SYRINGE | INTRAVENOUS | Status: DC | PRN
  Administered 2016-11-18: .2 mg via INTRAVENOUS

## 2016-11-18 MED ORDER — PROPOFOL 10 MG/ML IV BOLUS
INTRAVENOUS | Status: DC | PRN
  Administered 2016-11-18: 200 mg via INTRAVENOUS

## 2016-11-18 MED ORDER — ESTRADIOL 0.1 MG/GM VA CREA
TOPICAL_CREAM | VAGINAL | Status: AC
  Filled 2016-11-18: qty 42.5

## 2016-11-18 MED ORDER — FENTANYL CITRATE (PF) 100 MCG/2ML IJ SOLN
INTRAMUSCULAR | Status: AC
Start: 2016-11-18 — End: 2016-11-18
  Filled 2016-11-18: qty 2

## 2016-11-18 MED ORDER — SCOPOLAMINE 1 MG/3DAYS TD PT72
MEDICATED_PATCH | TRANSDERMAL | Status: AC
  Filled 2016-11-18: qty 1

## 2016-11-18 MED ORDER — OXYCODONE HCL 5 MG/5ML PO SOLN
5.0000 mg | Freq: Once | ORAL | Status: DC | PRN
  Filled 2016-11-18: qty 5

## 2016-11-18 MED ORDER — BUTORPHANOL TARTRATE 2 MG/ML IJ SOLN
1.0000 mg | INTRAMUSCULAR | Status: DC | PRN
  Administered 2016-11-18: 2 mg via INTRAVENOUS
  Filled 2016-11-18: qty 1

## 2016-11-18 MED ORDER — GLYCOPYRROLATE 0.2 MG/ML IV SOSY
PREFILLED_SYRINGE | INTRAVENOUS | Status: AC
  Filled 2016-11-18: qty 3

## 2016-11-18 MED ORDER — MIDAZOLAM HCL 2 MG/2ML IJ SOLN
INTRAMUSCULAR | Status: AC
  Filled 2016-11-18: qty 2

## 2016-11-18 MED ORDER — SODIUM CHLORIDE 0.9 % IJ SOLN
INTRAMUSCULAR | Status: AC
  Filled 2016-11-18: qty 50

## 2016-11-18 MED ORDER — HYDROMORPHONE HCL 1 MG/ML IJ SOLN
0.2500 mg | INTRAMUSCULAR | Status: DC | PRN
  Administered 2016-11-18 (×2): 0.5 mg via INTRAVENOUS
  Filled 2016-11-18: qty 0.5

## 2016-11-18 MED ORDER — NALOXONE HCL 0.4 MG/ML IJ SOLN: 0.4000 mg | INTRAMUSCULAR | Status: DC | PRN

## 2016-11-18 MED ORDER — BUPIVACAINE LIPOSOME 1.3 % IJ SUSP
INTRAMUSCULAR | Status: AC
  Filled 2016-11-18: qty 20

## 2016-11-18 MED ORDER — HYDROMORPHONE HCL 1 MG/ML IJ SOLN
INTRAMUSCULAR | Status: AC
  Filled 2016-11-18: qty 0.5

## 2016-11-18 MED ORDER — LACTATED RINGERS IV SOLN
INTRAVENOUS | Status: DC
  Administered 2016-11-18 (×2): via INTRAVENOUS
  Filled 2016-11-18: qty 1000

## 2016-11-18 MED ORDER — DIPHENHYDRAMINE HCL 50 MG/ML IJ SOLN: 12.5000 mg | Freq: Four times a day (QID) | INTRAMUSCULAR | Status: DC | PRN

## 2016-11-18 MED ORDER — DEXAMETHASONE SODIUM PHOSPHATE 4 MG/ML IJ SOLN
INTRAMUSCULAR | Status: DC | PRN
  Administered 2016-11-18: 10 mg via INTRAVENOUS

## 2016-11-18 MED ORDER — MIDAZOLAM HCL 5 MG/5ML IJ SOLN
INTRAMUSCULAR | Status: DC | PRN
  Administered 2016-11-18: 2 mg via INTRAVENOUS

## 2016-11-18 MED ORDER — DEXAMETHASONE SODIUM PHOSPHATE 10 MG/ML IJ SOLN
INTRAMUSCULAR | Status: AC
  Filled 2016-11-18: qty 1

## 2016-11-18 MED ORDER — DEXTROSE IN LACTATED RINGERS 5 % IV SOLN
INTRAVENOUS | Status: DC
  Administered 2016-11-18 – 2016-11-19 (×3): via INTRAVENOUS

## 2016-11-18 MED ORDER — 0.9 % SODIUM CHLORIDE (POUR BTL) OPTIME
TOPICAL | Status: DC | PRN
  Administered 2016-11-18: 1000 mL

## 2016-11-18 MED ORDER — ROCURONIUM BROMIDE 50 MG/5ML IV SOSY
PREFILLED_SYRINGE | INTRAVENOUS | Status: DC | PRN
  Administered 2016-11-18: 10 mg via INTRAVENOUS
  Administered 2016-11-18: 40 mg via INTRAVENOUS

## 2016-11-18 MED ORDER — GENTAMICIN SULFATE 40 MG/ML IJ SOLN
INTRAVENOUS | Status: AC
  Administered 2016-11-18: 100 mL via INTRAVENOUS
  Filled 2016-11-18: qty 7.25

## 2016-11-18 MED ORDER — KETOROLAC TROMETHAMINE 30 MG/ML IJ SOLN
30.0000 mg | Freq: Four times a day (QID) | INTRAMUSCULAR | Status: DC
Start: 2016-11-18 — End: 2016-11-18
  Administered 2016-11-18: 30 mg via INTRAVENOUS
  Filled 2016-11-18: qty 1

## 2016-11-18 MED ORDER — SUGAMMADEX SODIUM 200 MG/2ML IV SOLN
INTRAVENOUS | Status: DC | PRN
  Administered 2016-11-18: 200 mg via INTRAVENOUS

## 2016-11-18 MED ORDER — SODIUM CHLORIDE 0.9% FLUSH: 9.0000 mL | INTRAVENOUS | Status: DC | PRN

## 2016-11-18 MED ORDER — BUPIVACAINE HCL 0.25 % IJ SOLN
INTRAMUSCULAR | Status: DC | PRN
  Administered 2016-11-18: 46 mL

## 2016-11-18 MED ORDER — LIDOCAINE 2% (20 MG/ML) 5 ML SYRINGE
INTRAMUSCULAR | Status: DC | PRN
  Administered 2016-11-18: 80 mg via INTRAVENOUS

## 2016-11-18 MED ORDER — BUPIVACAINE LIPOSOME 1.3 % IJ SUSP
INTRAMUSCULAR | Status: DC | PRN
  Administered 2016-11-18: 20 mL

## 2016-11-18 MED ORDER — BUPIVACAINE HCL (PF) 0.25 % IJ SOLN
INTRAMUSCULAR | Status: AC
  Filled 2016-11-18: qty 30

## 2016-11-18 MED ORDER — LIDOCAINE 2% (20 MG/ML) 5 ML SYRINGE
INTRAMUSCULAR | Status: AC
  Filled 2016-11-18: qty 5

## 2016-11-18 MED ORDER — ONDANSETRON HCL 4 MG/2ML IJ SOLN
4.0000 mg | Freq: Four times a day (QID) | INTRAMUSCULAR | Status: DC | PRN
  Filled 2016-11-18: qty 2

## 2016-11-18 MED ORDER — ONDANSETRON HCL 4 MG/2ML IJ SOLN
INTRAMUSCULAR | Status: DC | PRN
  Administered 2016-11-18: 4 mg via INTRAVENOUS

## 2016-11-18 MED ORDER — WHITE PETROLATUM GEL
Status: AC
  Filled 2016-11-18: qty 5

## 2016-11-18 MED ORDER — OXYCODONE HCL 5 MG PO TABS
5.0000 mg | ORAL_TABLET | Freq: Once | ORAL | Status: DC | PRN
  Filled 2016-11-18: qty 1

## 2016-11-18 MED ORDER — SCOPOLAMINE 1 MG/3DAYS TD PT72
MEDICATED_PATCH | TRANSDERMAL | Status: DC | PRN
  Administered 2016-11-18: 1 via TRANSDERMAL

## 2016-11-18 MED ORDER — MENTHOL 3 MG MT LOZG: 1.0000 | LOZENGE | OROMUCOSAL | Status: DC | PRN

## 2016-11-18 MED ORDER — OXYCODONE-ACETAMINOPHEN 5-325 MG PO TABS
1.0000 | ORAL_TABLET | ORAL | Status: DC | PRN
  Administered 2016-11-19 – 2016-11-20 (×5): 2 via ORAL
  Filled 2016-11-18 (×5): qty 2

## 2016-11-18 MED ORDER — MORPHINE SULFATE 2 MG/ML IV SOLN: INTRAVENOUS | Status: DC

## 2016-11-18 MED ORDER — ROCURONIUM BROMIDE 50 MG/5ML IV SOSY
PREFILLED_SYRINGE | INTRAVENOUS | Status: AC
  Filled 2016-11-18: qty 5

## 2016-11-18 MED ORDER — KETOROLAC TROMETHAMINE 30 MG/ML IJ SOLN
30.0000 mg | Freq: Four times a day (QID) | INTRAMUSCULAR | Status: DC
  Administered 2016-11-18 – 2016-11-19 (×2): 30 mg via INTRAVENOUS
  Filled 2016-11-18 (×2): qty 1

## 2016-11-18 SURGICAL SUPPLY — 78 items
BLADE SURG 10 STRL SS (BLADE) ×2 IMPLANT
BLADE SURG 11 STRL SS (BLADE) ×2 IMPLANT
CATH ROBINSON RED A/P 16FR (CATHETERS) ×2 IMPLANT
CELLS DAT CNTRL 66122 CELL SVR (MISCELLANEOUS) ×1 IMPLANT
CLEANER CAUTERY TIP 5X5 PAD (MISCELLANEOUS) ×1 IMPLANT
COVER BACK TABLE 60X90IN (DRAPES) ×4 IMPLANT
COVER MAYO STAND STRL (DRAPES) ×4 IMPLANT
DRAPE LG THREE QUARTER DISP (DRAPES) ×2 IMPLANT
DRAPE UNDERBUTTOCKS STRL (DRAPE) ×2 IMPLANT
DRSG COVADERM PLUS 2X2 (GAUZE/BANDAGES/DRESSINGS) ×1 IMPLANT
DRSG OPSITE POSTOP 3X4 (GAUZE/BANDAGES/DRESSINGS) IMPLANT
DRSG OPSITE POSTOP 4X10 (GAUZE/BANDAGES/DRESSINGS) ×1 IMPLANT
DRSG PAD ABDOMINAL 8X10 ST (GAUZE/BANDAGES/DRESSINGS) ×1 IMPLANT
DRSG TEGADERM 2-3/8X2-3/4 SM (GAUZE/BANDAGES/DRESSINGS) ×2 IMPLANT
ELECT LIGASURE LONG (ELECTRODE) IMPLANT
ELECT LIGASURE SHORT 9 REUSE (ELECTRODE) ×2 IMPLANT
ELECT REM PT RETURN 9FT ADLT (ELECTROSURGICAL) ×2
ELECTRODE REM PT RTRN 9FT ADLT (ELECTROSURGICAL) ×1 IMPLANT
GLOVE BIO SURGEON STRL SZ 6.5 (GLOVE) ×4 IMPLANT
GLOVE BIO SURGEON STRL SZ7 (GLOVE) ×6 IMPLANT
GLOVE BIO SURGEON STRL SZ7.5 (GLOVE) ×3 IMPLANT
GOWN STRL REUS W/ TWL LRG LVL3 (GOWN DISPOSABLE) ×4 IMPLANT
GOWN STRL REUS W/TWL LRG LVL3 (GOWN DISPOSABLE) ×8
HOLDER FOLEY CATH W/STRAP (MISCELLANEOUS) ×2 IMPLANT
KIT ROOM TURNOVER WOR (KITS) ×2 IMPLANT
LIQUID BAND (GAUZE/BANDAGES/DRESSINGS) ×2 IMPLANT
MANIFOLD NEPTUNE II (INSTRUMENTS) IMPLANT
NDL HYPO 25X1 1.5 SAFETY (NEEDLE) ×1 IMPLANT
NDL INSUFFLATION 14GA 120MM (NEEDLE) ×1 IMPLANT
NDL SPNL 20GX3.5 QUINCKE YW (NEEDLE) IMPLANT
NDL SPNL 22GX3.5 QUINCKE BK (NEEDLE) IMPLANT
NEEDLE HYPO 25X1 1.5 SAFETY (NEEDLE) ×2 IMPLANT
NEEDLE INSUFFLATION 14GA 120MM (NEEDLE) ×2 IMPLANT
NEEDLE SPNL 20GX3.5 QUINCKE YW (NEEDLE) ×2 IMPLANT
NEEDLE SPNL 22GX3.5 QUINCKE BK (NEEDLE) IMPLANT
NS IRRIG 500ML POUR BTL (IV SOLUTION) ×2 IMPLANT
PACK BASIN DAY SURGERY FS (CUSTOM PROCEDURE TRAY) ×2 IMPLANT
PACKING VAGINAL (PACKING) IMPLANT
PAD CLEANER CAUTERY TIP 5X5 (MISCELLANEOUS) ×1
PAD OB MATERNITY 4.3X12.25 (PERSONAL CARE ITEMS) ×2 IMPLANT
PADDING ION DISPOSABLE (MISCELLANEOUS) ×2 IMPLANT
PENCIL BUTTON HOLSTER BLD 10FT (ELECTRODE) ×2 IMPLANT
RETRACTOR WND ALEXIS 18 MED (MISCELLANEOUS) IMPLANT
RTRCTR WOUND ALEXIS 18CM MED (MISCELLANEOUS) ×2
SEALER TISSUE G2 CVD JAW 45CM (ENDOMECHANICALS) ×2 IMPLANT
SET IRRIG TUBING LAPAROSCOPIC (IRRIGATION / IRRIGATOR) IMPLANT
SHEET LAVH (DRAPES) ×2 IMPLANT
SOLUTION ANTI FOG 6CC (MISCELLANEOUS) ×2 IMPLANT
SOLUTION ELECTROLUBE (MISCELLANEOUS) IMPLANT
SPONGE GAUZE 2X2 8PLY STRL LF (GAUZE/BANDAGES/DRESSINGS) ×2 IMPLANT
SPONGE GAUZE 4X4 12PLY STER LF (GAUZE/BANDAGES/DRESSINGS) IMPLANT
SPONGE LAP 18X18 X RAY DECT (DISPOSABLE) ×2 IMPLANT
SPONGE LAP 4X18 X RAY DECT (DISPOSABLE) ×2 IMPLANT
STRIP CLOSURE SKIN 1/4X3 (GAUZE/BANDAGES/DRESSINGS) IMPLANT
SUT MNCRL AB 4-0 PS2 18 (SUTURE) ×2 IMPLANT
SUT MON AB 2-0 CT1 36 (SUTURE) IMPLANT
SUT PDS AB 0 CT1 36 (SUTURE) ×2 IMPLANT
SUT VIC AB 0 CT1 18XCR BRD8 (SUTURE) ×1 IMPLANT
SUT VIC AB 0 CT1 36 (SUTURE) ×2 IMPLANT
SUT VIC AB 0 CT1 8-18 (SUTURE) ×2
SUT VIC AB 2-0 CT1 (SUTURE) ×2 IMPLANT
SUT VIC AB 2-0 UR6 27 (SUTURE) IMPLANT
SUT VICRYL 0 TIES 12 18 (SUTURE) ×2 IMPLANT
SUT VICRYL 0 UR6 27IN ABS (SUTURE) IMPLANT
SUT VICRYL 4-0 PS2 18IN ABS (SUTURE) IMPLANT
SYR BULB IRRIGATION 50ML (SYRINGE) ×2 IMPLANT
SYR CONTROL 10ML LL (SYRINGE) ×2 IMPLANT
SYRINGE 10CC LL (SYRINGE) ×4 IMPLANT
TOWEL OR 17X24 6PK STRL BLUE (TOWEL DISPOSABLE) ×4 IMPLANT
TRAY DSU PREP LF (CUSTOM PROCEDURE TRAY) ×2 IMPLANT
TRAY FOLEY CATH SILVER 14FR (SET/KITS/TRAYS/PACK) ×2 IMPLANT
TROCAR OPTI TIP 5M 100M (ENDOMECHANICALS) ×2 IMPLANT
TROCAR XCEL DIL TIP R 11M (ENDOMECHANICALS) ×2 IMPLANT
TUBE CONNECTING 12X1/4 (SUCTIONS) ×4 IMPLANT
TUBING INSUFFLATION 10FT LAP (TUBING) ×2 IMPLANT
WARMER LAPAROSCOPE (MISCELLANEOUS) ×2 IMPLANT
WATER STERILE IRR 500ML POUR (IV SOLUTION) ×2 IMPLANT
YANKAUER SUCT BULB TIP NO VENT (SUCTIONS) ×2 IMPLANT

## 2016-11-18 NOTE — Transfer of Care (Signed)
Immediate Anesthesia Transfer of Care Note  Patient: Vanessa SchwalbeKaren Chapman  Procedure(s) Performed: Procedure(s) (LRB): ATTEMPTED LAPAROSCOPIC ASSISTED VAGINAL HYSTERECTOMY WITH SALPINGECTOMY: LYSIS OF ADHESIONS ABDOMINAL HYSTERECTOMY WTIY SALPIGECTOMY (Bilateral)  Patient Location: PACU  Anesthesia Type: General  Level of Consciousness: awake, oriented, sedated and patient cooperative  Airway & Oxygen Therapy: Patient Spontanous Breathing and Patient connected to face mask oxygen  Post-op Assessment: Report given to PACU RN and Post -op Vital signs reviewed and stable  Post vital signs: Reviewed and stable  Complications: No apparent anesthesia complications  Last Vitals:  Vitals:   11/18/16 0602 11/18/16 0905  BP: 128/77   Pulse: (!) 52 (!) 59  Resp: 14 15  Temp: 36.6 C

## 2016-11-18 NOTE — Anesthesia Preprocedure Evaluation (Signed)
Anesthesia Evaluation  Patient identified by MRN, date of birth, ID band Patient awake    Reviewed: Allergy & Precautions, H&P , NPO status , Patient's Chart, lab work & pertinent test results  History of Anesthesia Complications (+) PONV  Airway Mallampati: II   Neck ROM: full    Dental   Pulmonary asthma ,    breath sounds clear to auscultation       Cardiovascular hypertension,  Rhythm:regular Rate:Normal     Neuro/Psych    GI/Hepatic GERD  ,  Endo/Other  Hypothyroidism   Renal/GU      Musculoskeletal   Abdominal   Peds  Hematology   Anesthesia Other Findings   Reproductive/Obstetrics                             Anesthesia Physical Anesthesia Plan  ASA: II  Anesthesia Plan: General   Post-op Pain Management:    Induction: Intravenous  Airway Management Planned: Oral ETT  Additional Equipment:   Intra-op Plan:   Post-operative Plan: Extubation in OR  Informed Consent: I have reviewed the patients History and Physical, chart, labs and discussed the procedure including the risks, benefits and alternatives for the proposed anesthesia with the patient or authorized representative who has indicated his/her understanding and acceptance.     Plan Discussed with: CRNA, Anesthesiologist and Surgeon  Anesthesia Plan Comments:         Anesthesia Quick Evaluation

## 2016-11-18 NOTE — Anesthesia Postprocedure Evaluation (Signed)
Anesthesia Post Note  Patient: Vanessa SchwalbeKaren Chapman  Procedure(s) Performed: Procedure(s) (LRB): ATTEMPTED LAPAROSCOPIC ASSISTED VAGINAL HYSTERECTOMY WITH SALPINGECTOMY: LYSIS OF ADHESIONS ABDOMINAL HYSTERECTOMY WTIY SALPIGECTOMY (Bilateral)  Patient location during evaluation: PACU Anesthesia Type: General Level of consciousness: awake and alert and patient cooperative Pain management: pain level controlled Vital Signs Assessment: post-procedure vital signs reviewed and stable Respiratory status: spontaneous breathing and respiratory function stable Cardiovascular status: stable Anesthetic complications: no    Last Vitals:  Vitals:   11/18/16 0930 11/18/16 0945  BP: (!) 156/83 (!) 164/79  Pulse: (!) 54 (!) 58  Resp: 15 19  Temp: 36.4 C     Last Pain:  Vitals:   11/18/16 1000  TempSrc:   PainSc: 8                  Otis Portal S

## 2016-11-18 NOTE — Op Note (Signed)
Preoperative diagnosis: Pelvic pain, adenomyosis  Postoperative diagnosis: Same plus extensive anterior uterine adhesions  Procedure: Diagnostic laparoscopy followed by TAH, bilateral salpingectomy, lysis of adhesions  Surgeon: Marcelle OverlieHolland  Assistant: McComb  EBL: 100 cc  Specimens removed: Uterus, bilateral tubes, to pathology  Procedure and findings:  Patient taken the operating room after an adequate level of general anesthesia was obtained patient's leg stirrups the abdomen perineum and vagina were prepped and draped and the bladder was drained appropriate timeouts were taken at that point. The uterus was upper limit normal size anterior, Hulka tenaculum was positioned. Attention directed to the subumbilical area which was infiltrated with quarter percent Marcaine plain small incision was made in the Verres needle was introduced without difficulty. Its intra-abdominal position was verified by pressure water testing. After 2 L pneumoperitoneum syncopated, lap scopic trocar and sleeve were then introduced that difficulty. 3 finger breaths above the symphysis in midline a 5 mm trocar was inserted under direct visualization. The patient was then placed in Trendelenburg and the pelvic findings as follows:  The upper abdomen was unremarkable the appendix was identified and was normal uterus itself was 8 weeks size symmetrically enlarged extensive anterior bladder flap and anterior uterine adhesions into the bladder area and lower abdominal wall. Bilateral tubes and ovaries and cul-de-sac area were unremarkable. Decision made proceed with TAH.  Foley catheter was position, the Hulka tenaculum was removed legs were placed supine. Pfannenstiel incision made through the old transverse scar carried down to the fascia which was incised and extended transversely. Rectus muscle divided in the midline peritoneum entered superiorly without incident and extended in a vertical fashion. The Alexis retractor was in  position, bowel Speck superiorly out of the field patient repositioned in Trendelenburg. Long Kelly clamps were then placed in each utero-ovarian pedicle keeping the uterus on traction the anterior adhesions were dissected close to the uterus with sharp dissection, the bladder plane was developed and the bladder dissected below the cervical vaginal junction. Starting on the left the LigaSure was used to perform salpingectomy by coag and cut technique. The same on the opposite side both ovaries were conserved both tubes were removed. After this was completed the utero-ovarian pedicle was doubly clamped divided first free tie followed suture ligature 0 Vicryl each side. The same for the round ligament. Assuring that the bladder was well below and staying close to the uterus the ascending branch the uterine artery was clamped divided and suture ligated followed by the cardinal ligament uterosacral ligament and cervical vaginal pedicles. The specimen was thus excised. Cuff was closed with interrupted 2-0 Vicryl sutures. The pelvis is in irrigated with saline operative site was inspected and noted be hemostatic both ovaries were suspended to the round ligament with a simple suture on each side. Prior to closure sponge, needle, history counts reported as correct 2. Peritoneum closed with a 2-0 Vicryl running suture same for the rectus muscles in the midline 0 PDS used to close the fascia transversely. Diluted Exparel solution 20 cc with 20 cc saline on the same amount of quarter percent Marcaine was then used to inject the subfascial and subcutaneous areas. Septated tissue was undermined to reduce traction, this is made hemostatic with the Bovie was not closed separately 4-0 Monocryl subcuticular skin closure with good reapproximation the laparoscopy incisions were glued with Dermabond pressure dressing applied clear urine noted in the case. She tolerated this well went to recovery room in good condition.  Dictated  with [facility]  Vanessa Chapman Vanessa ObeyM Vanessa Chapman  M.D. 

## 2016-11-18 NOTE — Progress Notes (Signed)
The patient was re-examined with no change in status 

## 2016-11-18 NOTE — Anesthesia Procedure Notes (Signed)
Procedure Name: Intubation Date/Time: 11/18/2016 7:28 AM Performed by: Renella CunasHAZEL, Marykay Mccleod D Pre-anesthesia Checklist: Patient identified, Emergency Drugs available, Suction available and Patient being monitored Patient Re-evaluated:Patient Re-evaluated prior to inductionOxygen Delivery Method: Circle system utilized Preoxygenation: Pre-oxygenation with 100% oxygen Intubation Type: IV induction Ventilation: Mask ventilation without difficulty Laryngoscope Size: Mac and 3 Grade View: Grade I Tube type: Oral Tube size: 7.0 mm Number of attempts: 1 Airway Equipment and Method: Stylet and Oral airway Placement Confirmation: ETT inserted through vocal cords under direct vision,  positive ETCO2 and breath sounds checked- equal and bilateral Secured at: 21 cm Tube secured with: Tape Dental Injury: Teeth and Oropharynx as per pre-operative assessment

## 2016-11-19 ENCOUNTER — Encounter (HOSPITAL_BASED_OUTPATIENT_CLINIC_OR_DEPARTMENT_OTHER): Payer: Self-pay | Admitting: Obstetrics and Gynecology

## 2016-11-19 LAB — CBC
HEMATOCRIT: 34.4 % — AB (ref 36.0–46.0)
HEMOGLOBIN: 11.4 g/dL — AB (ref 12.0–15.0)
MCH: 32 pg (ref 26.0–34.0)
MCHC: 33.1 g/dL (ref 30.0–36.0)
MCV: 96.6 fL (ref 78.0–100.0)
Platelets: 216 10*3/uL (ref 150–400)
RBC: 3.56 MIL/uL — AB (ref 3.87–5.11)
RDW: 12.9 % (ref 11.5–15.5)
WBC: 14.3 10*3/uL — ABNORMAL HIGH (ref 4.0–10.5)

## 2016-11-19 NOTE — Progress Notes (Signed)
1 Day Post-Op Procedure(s) (LRB): ATTEMPTED LAPAROSCOPIC ASSISTED VAGINAL HYSTERECTOMY WITH SALPINGECTOMY: LYSIS OF ADHESIONS ABDOMINAL HYSTERECTOMY WTIY SALPIGECTOMY (Bilateral)  Subjective: Patient reports tolerating PO.    Objective: I have reviewed patient's vital signs, medications and labs. CBC    Component Value Date/Time   WBC 14.3 (H) 11/19/2016 0431   RBC 3.56 (L) 11/19/2016 0431   HGB 11.4 (L) 11/19/2016 0431   HCT 34.4 (L) 11/19/2016 0431   PLT 216 11/19/2016 0431   MCV 96.6 11/19/2016 0431   MCH 32.0 11/19/2016 0431   MCHC 33.1 11/19/2016 0431   RDW 12.9 11/19/2016 0431    Inc C'D, + BS  Assessment: s/p Procedure(s): ATTEMPTED LAPAROSCOPIC ASSISTED VAGINAL HYSTERECTOMY WITH SALPINGECTOMY: LYSIS OF ADHESIONS ABDOMINAL HYSTERECTOMY WTIY SALPIGECTOMY (Bilateral): stable  Plan: Advance diet Encourage ambulation Advance to PO medication   plan D/C in AM  LOS: 1 day    Bohdan Macho M 11/19/2016, 8:42 AM

## 2016-11-20 MED ORDER — OXYCODONE-ACETAMINOPHEN 5-325 MG PO TABS: 1.0000 | ORAL_TABLET | ORAL | 0 refills | Status: DC | PRN

## 2016-11-20 NOTE — Progress Notes (Signed)
11/20/16  0845  Reviewed discharge instructions with patient. Patient verbalized understanding of discharge instructions.  Copy of discharge instructions and prescription given to patient husband

## 2016-11-20 NOTE — Discharge Summary (Signed)
Physician Discharge Summary  Patient ID: Vanessa SchwalbeKaren Cauble MRN: [id number] DOB/AGE: [birth year] 46 y.o.  Admit date: 11/18/2016 Discharge date: 11/20/2016  Admission Diagnoses:pelvic pain, adenomyosis  Discharge Diagnoses: same + pelvic addhesions Active Problems:   Pelvic pain   Discharged Condition: good  Hospital Course: adm for LAVH>>dense ant adhesions noted, underwent TAH, LOA with bilat salpingectomy, on POD 1, was afeb/ diet advanced, by POD 2, tol PO well, afeb and ready for D/C  Consults: None  Significant Diagnostic Studies: labs:  CBC    Component Value Date/Time   WBC 14.3 (H) 11/19/2016 0431   RBC 3.56 (L) 11/19/2016 0431   HGB 11.4 (L) 11/19/2016 0431   HCT 34.4 (L) 11/19/2016 0431   PLT 216 11/19/2016 0431   MCV 96.6 11/19/2016 0431   MCH 32.0 11/19/2016 0431   MCHC 33.1 11/19/2016 0431   RDW 12.9 11/19/2016 0431     Treatments: surgery: DL>>TAH, LOA, bilat salpingectomy  Discharge Exam: Blood pressure 129/63, pulse 63, temperature 98.8 F (37.1 C), temperature source Oral, resp. rate 16, height 5\' 8"  (1.727 m), weight 68 kg (150 lb), SpO2 97 %. abd soft + BS, Inc C/D  Disposition: 01-Home or Self Care     Medication List    STOP taking these medications   metoprolol succinate 25 MG 24 hr tablet Commonly known as:  TOPROL-XL   ranitidine 75 MG tablet Commonly known as:  ZANTAC     TAKE these medications   ALPRAZolam 1 MG tablet Commonly known as:  XANAX Take 1 mg by mouth at bedtime as needed for anxiety.   COMBIVENT 18-103 MCG/ACT inhaler Generic drug:  albuterol-ipratropium Inhale 2 puffs into the lungs every 4 (four) hours as needed for wheezing or shortness of breath.   Eszopiclone 3 MG Tabs Take 3 mg by mouth at bedtime as needed for sleep.   fluticasone 50 MCG/ACT nasal spray Commonly known as:  FLONASE Place 1 spray into both nostrils as needed for allergies or rhinitis.   levothyroxine 100 MCG tablet Commonly known as:   SYNTHROID, LEVOTHROID Take 100 mcg by mouth daily before breakfast.   oxyCODONE-acetaminophen 5-325 MG tablet Commonly known as:  PERCOCET/ROXICET Take 1-2 tablets by mouth every 4 (four) hours as needed for severe pain (moderate to severe pain (when tolerating fluids)).      Follow-up Information    Meriel PicaHOLLAND,Conda Wannamaker M, MD. Schedule an appointment as soon as possible for a visit.   Specialty:  Obstetrics and Gynecology Contact information: [address] [place] [id number] [phone]           Signed: Meriel PicaHOLLAND,Ermie Glendenning M 11/20/2016, 7:01 AM

## 2017-02-13 ENCOUNTER — Other Ambulatory Visit: Payer: Self-pay | Admitting: Obstetrics and Gynecology

## 2017-02-13 DIAGNOSIS — Z1231 Encounter for screening mammogram for malignant neoplasm of breast: Secondary | ICD-10-CM

## 2017-02-20 ENCOUNTER — Other Ambulatory Visit: Payer: Self-pay | Admitting: Obstetrics and Gynecology

## 2017-02-20 DIAGNOSIS — G8918 Other acute postprocedural pain: Secondary | ICD-10-CM

## 2017-02-20 DIAGNOSIS — R1032 Left lower quadrant pain: Secondary | ICD-10-CM

## 2017-02-27 ENCOUNTER — Ambulatory Visit
Admission: RE | Admit: 2017-02-27 | Discharge: 2017-02-27 | Disposition: A | Discharge status: Home / Self Care | Payer: BLUE CROSS/BLUE SHIELD | Source: Ambulatory Visit | Attending: Obstetrics and Gynecology | Admitting: Obstetrics and Gynecology

## 2017-02-27 ENCOUNTER — Other Ambulatory Visit: Payer: Self-pay | Admitting: Obstetrics and Gynecology

## 2017-02-27 DIAGNOSIS — G8918 Other acute postprocedural pain: Secondary | ICD-10-CM

## 2017-02-27 DIAGNOSIS — R1032 Left lower quadrant pain: Secondary | ICD-10-CM

## 2017-03-10 ENCOUNTER — Other Ambulatory Visit: Payer: Self-pay | Admitting: Obstetrics and Gynecology

## 2017-03-10 ENCOUNTER — Ambulatory Visit
Admission: RE | Admit: 2017-03-10 | Discharge: 2017-03-10 | Disposition: A | Discharge status: Home / Self Care | Payer: BLUE CROSS/BLUE SHIELD | Source: Ambulatory Visit | Attending: Obstetrics and Gynecology | Admitting: Obstetrics and Gynecology

## 2017-03-10 DIAGNOSIS — Z1231 Encounter for screening mammogram for malignant neoplasm of breast: Secondary | ICD-10-CM

## 2017-05-27 DIAGNOSIS — R1032 Left lower quadrant pain: Secondary | ICD-10-CM | POA: Insufficient documentation

## 2017-07-23 DIAGNOSIS — G894 Chronic pain syndrome: Secondary | ICD-10-CM | POA: Insufficient documentation

## 2018-01-04 ENCOUNTER — Encounter: Payer: Self-pay | Admitting: Internal Medicine

## 2018-01-04 ENCOUNTER — Ambulatory Visit: Payer: BLUE CROSS/BLUE SHIELD | Admitting: Internal Medicine

## 2018-01-04 VITALS — BP 130/82 | HR 54 | Resp 16 | Ht 63.0 in | Wt 150.8 lb

## 2018-01-04 DIAGNOSIS — J324 Chronic pansinusitis: Secondary | ICD-10-CM

## 2018-01-04 DIAGNOSIS — J301 Allergic rhinitis due to pollen: Secondary | ICD-10-CM

## 2018-01-04 DIAGNOSIS — J4520 Mild intermittent asthma, uncomplicated: Secondary | ICD-10-CM

## 2018-01-04 NOTE — Patient Instructions (Signed)

## 2018-01-04 NOTE — Progress Notes (Signed)
[facility] [address] [place], [place] [address]  Pulmonary Sleep Medicine  Office Visit Note  Patient Name: Vanessa Chapman DOB: 1971/04/03 MRN [id number]  Date of Service: 01/04/2018  Complaints/HPI: She is here for follow-up of asthma allergic rhinitis and chronic sinusitis.  She has been doing very well since her sinus surgery was done.  She is off of her allergy shots.  She has had no exacerbations over asthma.  She has not been admitted to the hospital.  She is generally without cough or congestion.  She had a recent flareup but she was treated with antibiotics had to go to the walk-in clinic and this did seem to help her.  She right now is without any wheezing no fevers no chills.  ROS  General: (-) fever, (-) chills, (-) night sweats, (-) weakness Skin: (-) rashes, (-) itching,. Eyes: (-) visual changes, (-) redness, (-) itching. Nose and Sinuses: (-) nasal stuffiness or itchiness, (-) postnasal drip, (-) nosebleeds, (-) sinus trouble. Mouth and Throat: (-) sore throat, (-) hoarseness. Neck: (-) swollen glands, (-) enlarged thyroid, (-) neck pain. Respiratory: - cough, (-) bloody sputum, - shortness of breath, - wheezing. Cardiovascular: - ankle swelling, (-) chest pain. Lymphatic: (-) lymph node enlargement. Neurologic: (-) numbness, (-) tingling. Psychiatric: (-) anxiety, (-) depression   Current Medication: Outpatient Encounter Medications as of 01/04/2018  Medication Sig  . albuterol-ipratropium (COMBIVENT) 18-103 MCG/ACT inhaler Inhale 2 puffs into the lungs every 4 (four) hours as needed for wheezing or shortness of breath.   . ALPRAZolam (XANAX) 1 MG tablet Take 1 mg by mouth at bedtime as needed for anxiety.  . Eszopiclone 3 MG TABS Take 3 mg by mouth at bedtime as needed for sleep.  . fluticasone (FLONASE) 50 MCG/ACT nasal spray Place 1 spray into both nostrils as needed for allergies or rhinitis.  Marland Kitchen levothyroxine (SYNTHROID, LEVOTHROID) 100 MCG tablet  Take 100 mcg by mouth daily before breakfast.  . nortriptyline (PAMELOR) 10 MG capsule Take 10 mg by mouth at bedtime.  . OXcarbazepine (TRILEPTAL) 150 MG tablet Take 150 mg by mouth 2 (two) times daily.  Marland Kitchen oxyCODONE-acetaminophen (PERCOCET/ROXICET) 5-325 MG tablet Take 1-2 tablets by mouth every 4 (four) hours as needed for severe pain (moderate to severe pain (when tolerating fluids)). (Patient not taking: Reported on 01/04/2018)   No facility-administered encounter medications on file as of 01/04/2018.     Surgical History: Past Surgical History:  Procedure Laterality Date  . ABDOMINAL HYSTERECTOMY    . BALLOON SINUPLASTY  10/2015  . BREAST ENHANCEMENT SURGERY  2008  . CESAREAN SECTION  1996  . ENDOMETRIAL ABLATION W/ NOVASURE  01/2015  . EXCISIONAL HEMORRHOIDECTOMY  09/28/2006  . LAPAROSCOPIC VAGINAL HYSTERECTOMY WITH SALPINGECTOMY Bilateral 11/18/2016   Procedure: ATTEMPTED LAPAROSCOPIC ASSISTED VAGINAL HYSTERECTOMY WITH SALPINGECTOMY: LYSIS OF ADHESIONS ABDOMINAL HYSTERECTOMY Elsie Saas SALPIGECTOMY;  Surgeon: Richarda Overlie, MD;  Location: [facility];  Service: Gynecology;  Laterality: Bilateral;  . LUMBAR LAMINECTOMY  12/05/2008   L4 -- L5  . MICROTUBOPLASTY  2009   tubal ligation reversal  . TUBAL LIGATION Bilateral 1998    Medical History: Past Medical History:  Diagnosis Date  . Allergic asthma, mild intermittent, uncomplicated   . Depression   . GERD (gastroesophageal reflux disease)   . Heart murmur   . Hypertension   . Hypothyroidism   . Hypothyroidism   . Pelvic pain   . PONV (postoperative nausea and vomiting)   . Wears contact lenses  Family History: Family History  Adopted: Yes    Social History: Social History   Socioeconomic History  . Marital status: Married    Spouse name: Not on file  . Number of children: Not on file  . Years of education: Not on file  . Highest education level: Not on file  Social Needs  . Financial resource  strain: Not on file  . Food insecurity - worry: Not on file  . Food insecurity - inability: Not on file  . Transportation needs - medical: Not on file  . Transportation needs - non-medical: Not on file  Occupational History  . Not on file  Tobacco Use  . Smoking status: Never Smoker  . Smokeless tobacco: Never Used  Substance and Sexual Activity  . Alcohol use: Yes    Comment: ocassionally  . Drug use: No  . Sexual activity: Not on file  Other Topics Concern  . Not on file  Social History Narrative  . Not on file    Vital Signs: Blood pressure 130/82, pulse (!) 54, resp. rate 16, height 5\' 3"  (1.6 m), weight 150 lb 12.8 oz (68.4 kg), SpO2 95 %.  Examination: General Appearance: The patient is well-developed, well-nourished, and in no distress. Skin: Gross inspection of skin unremarkable. Head: normocephalic, no gross deformities. Eyes: no gross deformities noted. ENT: ears appear grossly normal no exudates. Neck: Supple. No thyromegaly. No LAD. Respiratory: no wheeze noted no rhonchi. Cardiovascular: Normal S1 and S2 without murmur or rub. Extremities: No cyanosis. pulses are equal. Neurologic: Alert and oriented. No involuntary movements.  LABS: No results found for this or any previous visit (from the past 2160 hour(s)).  Radiology: Mm Screening Breast W/implant Tomo Bilateral  Result Date: 03/11/2017 CLINICAL DATA:  Screening. EXAM: 2D DIGITAL SCREENING BILATERAL MAMMOGRAM WITH IMPLANTS, CAD AND ADJUNCT TOMO The patient has retropectoral implants. Standard and implant displaced views were performed. COMPARISON:  Previous exam(s). ACR Breast Density Category c: The breast tissue is heterogeneously dense, which may obscure small masses. FINDINGS: There are no findings suspicious for malignancy. Images were processed with CAD. IMPRESSION: No mammographic evidence of malignancy. A result letter of this screening mammogram will be mailed directly to the patient.  RECOMMENDATION: Screening mammogram in one year. (Code:SM-B-01Y) BI-RADS CATEGORY  1:  Negative. Electronically Signed   By: Ted Mcalpineobrinka  Dimitrova M.D.   On: 03/11/2017 12:24    No results found.  No results found.    Assessment and Plan: Patient Active Problem List   Diagnosis Date Noted  . Pelvic pain 11/18/2016    1. Asthma Well controlled will continue with present therapy Needs follow up PFT  To be scheduled at some point  2. Allergic rhinitis Off the allergy shots doing well We will continue with her current medications as prescribed  3. Chronic Sinusitis Has had surgery doing much better She has had no flare of acute sinusitis since   General Counseling: I have discussed the findings of the evaluation and examination with Vanessa BraunKaren.  I have also discussed any further diagnostic evaluation thatmay be needed or ordered today. Vanessa BraunKaren verbalizes understanding of the findings of todays visit. We also reviewed her medications today and discussed drug interactions and side effects including but not limited excessive drowsiness and altered mental states. We also discussed that there is always a risk not just to her but also people around her. she has been encouraged to call the office with any questions or concerns that should arise related to todays visit.  Time spent: 6mn  I have personally obtained a history, examined the patient, evaluated laboratory and imaging results, formulated the assessment and plan and placed orders.    SAllyne Gee MD [facility] Sleep medicine

## 2018-01-08 DIAGNOSIS — G8929 Other chronic pain: Secondary | ICD-10-CM | POA: Insufficient documentation

## 2018-01-08 DIAGNOSIS — R102 Pelvic and perineal pain: Secondary | ICD-10-CM

## 2018-03-02 ENCOUNTER — Telehealth: Payer: Self-pay

## 2018-03-02 NOTE — Telephone Encounter (Signed)
Pt called  That she is coughing  And she want to know that she can take otc I advised her to try delsym or mucinex

## 2018-03-02 NOTE — Telephone Encounter (Signed)
lmom that we returning her call regarding her  med

## 2018-04-06 ENCOUNTER — Encounter: Payer: Self-pay | Admitting: Internal Medicine

## 2018-04-06 ENCOUNTER — Ambulatory Visit: Payer: BLUE CROSS/BLUE SHIELD | Admitting: Internal Medicine

## 2018-04-06 ENCOUNTER — Ambulatory Visit
Admission: RE | Admit: 2018-04-06 | Discharge: 2018-04-06 | Disposition: A | Discharge status: Home / Self Care | Payer: BLUE CROSS/BLUE SHIELD | Source: Ambulatory Visit | Attending: Internal Medicine | Admitting: Internal Medicine

## 2018-04-06 VITALS — BP 120/70 | HR 62 | Resp 16 | Ht 63.0 in | Wt 146.6 lb

## 2018-04-06 DIAGNOSIS — J301 Allergic rhinitis due to pollen: Secondary | ICD-10-CM | POA: Diagnosis not present

## 2018-04-06 DIAGNOSIS — J324 Chronic pansinusitis: Secondary | ICD-10-CM | POA: Insufficient documentation

## 2018-04-06 DIAGNOSIS — N644 Mastodynia: Secondary | ICD-10-CM

## 2018-04-06 DIAGNOSIS — R079 Chest pain, unspecified: Secondary | ICD-10-CM

## 2018-04-06 DIAGNOSIS — J4520 Mild intermittent asthma, uncomplicated: Secondary | ICD-10-CM

## 2018-04-06 MED ORDER — IPRATROPIUM-ALBUTEROL 0.5-2.5 (3) MG/3ML IN SOLN: 3.0000 mL | Freq: Four times a day (QID) | RESPIRATORY_TRACT | 4 refills | Status: DC | PRN

## 2018-04-06 NOTE — Patient Instructions (Signed)
Chest Wall Pain Chest wall pain is pain in or around the bones and muscles of your chest. Sometimes, an injury causes this pain. Sometimes, the cause may not be known. This pain may take several weeks or longer to get better. Follow these instructions at home: Pay attention to any changes in your symptoms. Take these actions to help with your pain:  Rest as told by your doctor.  Avoid activities that cause pain. Try not to use your chest, belly (abdominal), or side muscles to lift heavy things.  If directed, apply ice to the painful area: ? Put ice in a plastic bag. ? Place a towel between your skin and the bag. ? Leave the ice on for 20 minutes, 2-3 times per day.  Take over-the-counter and prescription medicines only as told by your doctor.  Do not use tobacco products, including cigarettes, chewing tobacco, and e-cigarettes. If you need help quitting, ask your doctor.  Keep all follow-up visits as told by your doctor. This is important.  Contact a doctor if:  You have a fever.  Your chest pain gets worse.  You have new symptoms. Get help right away if:  You feel sick to your stomach (nauseous) or you throw up (vomit).  You feel sweaty or light-headed.  You have a cough with phlegm (sputum) or you cough up blood.  You are short of breath. This information is not intended to replace advice given to you by your health care provider. Make sure you discuss any questions you have with your health care provider. Document Released: 05/19/2008 Document Revised: 05/08/2016 Document Reviewed: 02/26/2015 Elsevier Interactive Patient Education  2018 [employer] Shortness of Breath, Adult Shortness of breath means you have trouble breathing. Your lungs are organs for breathing. Follow these instructions at home: Pay attention to any changes in your symptoms. Take these actions to help with your condition:  Do not smoke. Smoking can cause shortness of breath. If you need help to  quit smoking, ask your doctor.  Avoid things that can make it harder to breathe, such as: ? Mold. ? Dust. ? Air pollution. ? Chemical smells. ? Things that can cause allergy symptoms (allergens), if you have allergies.  Keep your living space clean and free of mold and dust.  Rest as needed. Slowly return to your usual activities.  Take over-the-counter and prescription medicines, including oxygen and inhaled medicines, only as told by your doctor.  Keep all follow-up visits as told by your doctor. This is important.  Contact a doctor if:  Your condition does not get better as soon as expected.  You have a hard time doing your normal activities, even after you rest.  You have new symptoms. Get help right away if:  You have trouble breathing when you are resting.  You feel light-headed or you faint.  You have a cough that is not helped by medicines.  You cough up blood.  You have pain with breathing.  You have pain in your chest, arms, shoulders, or belly (abdomen).  You have a fever.  You cannot walk up stairs.  You cannot exercise the way you normally do. This information is not intended to replace advice given to you by your health care provider. Make sure you discuss any questions you have with your health care provider. Document Released: 05/19/2008 Document Revised: 12/18/2016 Document Reviewed: 12/18/2016 Elsevier Interactive Patient Education  2017 [employer]

## 2018-04-06 NOTE — Progress Notes (Signed)
[facility] [address] [place], [place] [address]  Pulmonary Sleep Medicine   Office Visit Note  Patient Name: Vanessa Chapman DOB: 07/14/1971 MRN [id number]  Date of Service: 04/06/2018  Complaints/HPI:  Patient comes in because of shortness breath and chest pain.  She states that she has had shortness breath along with pain in her chest more so on the left side and appears to be reproducible.  She states that the pain is definitely worse when she takes deep breath.  Her concern was that her asthma may be acting up.  When I evaluated her of the examination the lungs actually sounded pretty clear.  However when I put the stethoscope on her chest anteriorly on the left side she had pain which made her almost jump off of the examination table.  I called in the nurse Jody to chaperone and complete examination of the chest in she was exquisitely tender on the left breast extending into the axillary tail of the breast.  She does have a history of breast implants and recently did have a mammogram also done maybe about a week ago.  The implants are of the saline variety so there would be some concern with the onset of this pain if there may have been some unintended rupture of the implants.  ROS  General: (-) fever, (-) chills, (-) night sweats, (-) weakness Skin: (-) rashes, (-) itching,. Eyes: (-) visual changes, (-) redness, (-) itching. Nose and Sinuses: (-) nasal stuffiness or itchiness, (-) postnasal drip, (-) nosebleeds, (-) sinus trouble. Mouth and Throat: (-) sore throat, (-) hoarseness. Neck: (-) swollen glands, (-) enlarged thyroid, (-) neck pain. Respiratory: + cough, (-) bloody sputum, + shortness of breath, + wheezing. Cardiovascular: - ankle swelling, (-) chest pain. Lymphatic: (-) lymph node enlargement. Neurologic: (-) numbness, (-) tingling. Psychiatric: (-) anxiety, (-) depression   Current Medication: Outpatient Encounter Medications as of 04/06/2018   Medication Sig  . albuterol-ipratropium (COMBIVENT) 18-103 MCG/ACT inhaler Inhale 2 puffs into the lungs every 4 (four) hours as needed for wheezing or shortness of breath.   . ALPRAZolam (XANAX) 1 MG tablet Take 1 mg by mouth at bedtime as needed for anxiety.  . Eszopiclone 3 MG TABS Take 3 mg by mouth at bedtime as needed for sleep.  . fluticasone (FLONASE) 50 MCG/ACT nasal spray Place 1 spray into both nostrils as needed for allergies or rhinitis.  Marland Kitchen levothyroxine (SYNTHROID, LEVOTHROID) 100 MCG tablet Take 100 mcg by mouth daily before breakfast.  . nortriptyline (PAMELOR) 10 MG capsule Take 10 mg by mouth at bedtime.  . OXcarbazepine (TRILEPTAL) 150 MG tablet Take 150 mg by mouth 2 (two) times daily.  Marland Kitchen oxyCODONE-acetaminophen (PERCOCET/ROXICET) 5-325 MG tablet Take 1-2 tablets by mouth every 4 (four) hours as needed for severe pain (moderate to severe pain (when tolerating fluids)). (Patient not taking: Reported on 01/04/2018)   No facility-administered encounter medications on file as of 04/06/2018.     Surgical History: Past Surgical History:  Procedure Laterality Date  . ABDOMINAL HYSTERECTOMY    . BALLOON SINUPLASTY  10/2015  . BREAST ENHANCEMENT SURGERY  2008  . CESAREAN SECTION  1996  . ENDOMETRIAL ABLATION W/ NOVASURE  01/2015  . EXCISIONAL HEMORRHOIDECTOMY  09/28/2006  . LAPAROSCOPIC VAGINAL HYSTERECTOMY WITH SALPINGECTOMY Bilateral 11/18/2016   Procedure: ATTEMPTED LAPAROSCOPIC ASSISTED VAGINAL HYSTERECTOMY WITH SALPINGECTOMY: LYSIS OF ADHESIONS ABDOMINAL HYSTERECTOMY Elsie Saas SALPIGECTOMY;  Surgeon: Richarda Overlie, MD;  Location: [facility];  Service: Gynecology;  Laterality: Bilateral;  .  LUMBAR LAMINECTOMY  12/05/2008   L4 -- L5  . MICROTUBOPLASTY  2009   tubal ligation reversal  . TUBAL LIGATION Bilateral 1998    Medical History: Past Medical History:  Diagnosis Date  . Allergic asthma, mild intermittent, uncomplicated   . Depression   . GERD  (gastroesophageal reflux disease)   . Heart murmur   . Hypertension   . Hypothyroidism   . Hypothyroidism   . Pelvic pain   . PONV (postoperative nausea and vomiting)   . Wears contact lenses     Family History: Family History  Adopted: Yes    Social History: Social History   Socioeconomic History  . Marital status: Married    Spouse name: Not on file  . Number of children: Not on file  . Years of education: Not on file  . Highest education level: Not on file  Occupational History  . Not on file  Social Needs  . Financial resource strain: Not on file  . Food insecurity:    Worry: Not on file    Inability: Not on file  . Transportation needs:    Medical: Not on file    Non-medical: Not on file  Tobacco Use  . Smoking status: Never Smoker  . Smokeless tobacco: Never Used  Substance and Sexual Activity  . Alcohol use: Yes    Comment: ocassionally  . Drug use: No  . Sexual activity: Not on file  Lifestyle  . Physical activity:    Days per week: Not on file    Minutes per session: Not on file  . Stress: Not on file  Relationships  . Social connections:    Talks on phone: Not on file    Gets together: Not on file    Attends religious service: Not on file    Active member of club or organization: Not on file    Attends meetings of clubs or organizations: Not on file    Relationship status: Not on file  . Intimate partner violence:    Fear of current or ex partner: Not on file    Emotionally abused: Not on file    Physically abused: Not on file    Forced sexual activity: Not on file  Other Topics Concern  . Not on file  Social History Narrative  . Not on file    Vital Signs: Blood pressure 120/70, pulse 62, resp. rate 16, height 5\' 3"  (1.6 m), weight 146 lb 9.6 oz (66.5 kg), SpO2 99 %.  Examination: General Appearance: The patient is well-developed, well-nourished, and in no distress. Skin: Gross inspection of skin unremarkable. Head: normocephalic, no  gross deformities. Eyes: no gross deformities noted. ENT: ears appear grossly normal no exudates. Neck: Supple. No thyromegaly. No LAD. Respiratory: scattered rhonchi noted. Cardiovascular: Normal S1 and S2 without murmur or rub. Extremities: No cyanosis. pulses are equal. Neurologic: Alert and oriented. No involuntary movements.  LABS: No results found for this or any previous visit (from the past 2160 hour(s)).  Radiology: Mm Screening Breast W/implant Tomo Bilateral  Result Date: 03/11/2017 CLINICAL DATA:  Screening. EXAM: 2D DIGITAL SCREENING BILATERAL MAMMOGRAM WITH IMPLANTS, CAD AND ADJUNCT TOMO The patient has retropectoral implants. Standard and implant displaced views were performed. COMPARISON:  Previous exam(s). ACR Breast Density Category c: The breast tissue is heterogeneously dense, which may obscure small masses. FINDINGS: There are no findings suspicious for malignancy. Images were processed with CAD. IMPRESSION: No mammographic evidence of malignancy. A result letter of this screening mammogram  will be mailed directly to the patient. RECOMMENDATION: Screening mammogram in one year. (Code:SM-B-01Y) BI-RADS CATEGORY  1:  Negative. Electronically Signed   By: Ted Mcalpineobrinka  Dimitrova M.D.   On: 03/11/2017 12:24    No results found.  No results found.    Assessment and Plan: Patient Active Problem List   Diagnosis Date Noted  . Mild intermittent asthma without complication 04/06/2018  . Seasonal allergic rhinitis due to pollen 04/06/2018  . Chronic pansinusitis 04/06/2018  . Chronic pelvic pain in female 01/08/2018  . Chronic pain syndrome 07/23/2017  . Left lower quadrant pain 05/27/2017  . Pelvic pain 11/18/2016    1. Asthma  I do not think the asthma is the issue there are no rhonchi no wheezing noted.  She does not appear to be in acute exacerbation. 2. Allergic rhinitis  Her allergies have been under good control she continues to take her current  medications. 3. Chronic sinusitis  Stable no drainage noted at this time. 4. Chest Pain  Localized to the left side and worse on palpation 5. Left breast pain patient has exquisite pain on the left breast will get a ultrasound of the left breast and also ordered a plain chest film.  If there is any abnormality of the ultrasound 71 proceed for further evaluation.  Would recommend nonsteroidal inflammatory for the pain  General Counseling: I have discussed the findings of the evaluation and examination with Clydie BraunKaren.  I have also discussed any further diagnostic evaluation thatmay be needed or ordered today. Clydie BraunKaren verbalizes understanding of the findings of todays visit. We also reviewed her medications today and discussed drug interactions and side effects including but not limited excessive drowsiness and altered mental states. We also discussed that there is always a risk not just to her but also people around her. she has been encouraged to call the office with any questions or concerns that should arise related to todays visit.    Time spent: 25min  I have personally obtained a history, examined the patient, evaluated laboratory and imaging results, formulated the assessment and plan and placed orders.    Yevonne PaxSaadat A Larae Caison, MD [facility] Pulmonary and Critical Care Sleep medicine

## 2018-04-08 ENCOUNTER — Other Ambulatory Visit: Payer: Self-pay | Admitting: Internal Medicine

## 2018-04-08 DIAGNOSIS — N644 Mastodynia: Secondary | ICD-10-CM

## 2018-04-13 ENCOUNTER — Ambulatory Visit
Admission: RE | Admit: 2018-04-13 | Discharge: 2018-04-13 | Disposition: A | Discharge status: Home / Self Care | Payer: BLUE CROSS/BLUE SHIELD | Source: Ambulatory Visit | Attending: Internal Medicine | Admitting: Internal Medicine

## 2018-04-13 ENCOUNTER — Other Ambulatory Visit: Payer: Self-pay | Admitting: Internal Medicine

## 2018-04-13 DIAGNOSIS — N644 Mastodynia: Secondary | ICD-10-CM

## 2018-04-15 ENCOUNTER — Telehealth: Payer: Self-pay | Admitting: Internal Medicine

## 2018-04-15 NOTE — Telephone Encounter (Signed)
Pt advised mammogram is normal 

## 2018-05-04 ENCOUNTER — Ambulatory Visit: Payer: Self-pay | Admitting: Internal Medicine

## 2018-07-13 ENCOUNTER — Encounter: Payer: Self-pay | Admitting: Internal Medicine

## 2018-07-13 ENCOUNTER — Ambulatory Visit: Payer: BLUE CROSS/BLUE SHIELD | Admitting: Internal Medicine

## 2018-07-13 VITALS — BP 148/82 | HR 67 | Resp 16 | Ht 63.0 in | Wt 143.2 lb

## 2018-07-13 DIAGNOSIS — J324 Chronic pansinusitis: Secondary | ICD-10-CM | POA: Diagnosis not present

## 2018-07-13 DIAGNOSIS — J4520 Mild intermittent asthma, uncomplicated: Secondary | ICD-10-CM | POA: Diagnosis not present

## 2018-07-13 DIAGNOSIS — J301 Allergic rhinitis due to pollen: Secondary | ICD-10-CM

## 2018-07-13 DIAGNOSIS — R0789 Other chest pain: Secondary | ICD-10-CM

## 2018-07-13 NOTE — Patient Instructions (Signed)

## 2018-07-13 NOTE — Progress Notes (Signed)
[facility] [address] [place], [place] [address]  Pulmonary Sleep Medicine   Office Visit Note  Patient Name: Vanessa SchwalbeKaren Wilbanks DOB: 1971/04/12 MRN [id number]  Date of Service: 07/13/2018  Complaints/HPI:  She states that she is doing well has not had any issues since the last visit.  She still has some pain in her left breast.  She had undergone an ex ostomy workup and there was no obvious cause for the pain.  Suspect that it is related to her implants or use of estrogens.  She is having no issues with her asthma.  She does states that she is having more issues with her allergies.  She completed allergy shots in 2017 right now she does not want to go back on his shots and she is going to try using antihistamines for the present time  ROS  General: (-) fever, (-) chills, (-) night sweats, (-) weakness Skin: (-) rashes, (-) itching,. Eyes: (-) visual changes, (-) redness, (-) itching. Nose and Sinuses: (-) nasal stuffiness or itchiness, (-) postnasal drip, (-) nosebleeds, (-) sinus trouble. Mouth and Throat: (-) sore throat, (-) hoarseness. Neck: (-) swollen glands, (-) enlarged thyroid, (-) neck pain. Respiratory: - cough, (-) bloody sputum, - shortness of breath, - wheezing. Cardiovascular: - ankle swelling, (-) chest pain. Lymphatic: (-) lymph node enlargement. Neurologic: (-) numbness, (-) tingling. Psychiatric: (-) anxiety, (-) depression   Current Medication: Outpatient Encounter Medications as of 07/13/2018  Medication Sig  . ALPRAZolam (XANAX) 1 MG tablet Take 1 mg by mouth at bedtime as needed for anxiety.  . Eszopiclone 3 MG TABS Take 3 mg by mouth at bedtime as needed for sleep.  . fluticasone (FLONASE) 50 MCG/ACT nasal spray Place 1 spray into both nostrils as needed for allergies or rhinitis.  Marland Kitchen. ipratropium-albuterol (DUONEB) 0.5-2.5 (3) MG/3ML SOLN Take 3 mLs by nebulization every 6 (six) hours as needed.  Marland Kitchen. levothyroxine (SYNTHROID, LEVOTHROID) 100 MCG  tablet Take 100 mcg by mouth daily before breakfast.  . DIVIGEL 1 MG/GM GEL APP ONE PACKET D  . INTRAROSA 6.5 MG INST INSERT 1 SPPLICATORFUL VAGINALLY AT BEDTIME NIGHTLY  . [DISCONTINUED] nortriptyline (PAMELOR) 10 MG capsule Take 10 mg by mouth at bedtime.  . [DISCONTINUED] OXcarbazepine (TRILEPTAL) 150 MG tablet Take 150 mg by mouth 2 (two) times daily.  . [DISCONTINUED] oxyCODONE-acetaminophen (PERCOCET/ROXICET) 5-325 MG tablet Take 1-2 tablets by mouth every 4 (four) hours as needed for severe pain (moderate to severe pain (when tolerating fluids)). (Patient not taking: Reported on 01/04/2018)   No facility-administered encounter medications on file as of 07/13/2018.     Surgical History: Past Surgical History:  Procedure Laterality Date  . ABDOMINAL HYSTERECTOMY    . BALLOON SINUPLASTY  10/2015  . BREAST ENHANCEMENT SURGERY  2008  . CESAREAN SECTION  1996  . ENDOMETRIAL ABLATION W/ NOVASURE  01/2015  . EXCISIONAL HEMORRHOIDECTOMY  09/28/2006  . LAPAROSCOPIC VAGINAL HYSTERECTOMY WITH SALPINGECTOMY Bilateral 11/18/2016   Procedure: ATTEMPTED LAPAROSCOPIC ASSISTED VAGINAL HYSTERECTOMY WITH SALPINGECTOMY: LYSIS OF ADHESIONS ABDOMINAL HYSTERECTOMY Elsie SaasWTIY SALPIGECTOMY;  Surgeon: Richarda Overlieichard Holland, MD;  [facility] [facility];  Service: Gynecology;  Laterality: Bilateral;  . LUMBAR LAMINECTOMY  12/05/2008   L4 -- L5  . MICROTUBOPLASTY  2009   tubal ligation reversal  . TUBAL LIGATION Bilateral 1998    Medical History: Past Medical History:  Diagnosis Date  . Allergic asthma, mild intermittent, uncomplicated   . Depression   . GERD (gastroesophageal reflux disease)   . Heart murmur   .  Hypertension   . Hypothyroidism   . Hypothyroidism   . Pelvic pain   . PONV (postoperative nausea and vomiting)   . Wears contact lenses     Family History: Family History  Adopted: Yes  Problem Relation Age of Onset  . Breast cancer Neg Hx     Social History: Social History    Socioeconomic History  . Marital status: Married    Spouse name: Not on file  . Number of children: Not on file  . Years of education: Not on file  . Highest education level: Not on file  Occupational History  . Not on file  Social Needs  . Financial resource strain: Not on file  . Food insecurity:    Worry: Not on file    Inability: Not on file  . Transportation needs:    Medical: Not on file    Non-medical: Not on file  Tobacco Use  . Smoking status: Never Smoker  . Smokeless tobacco: Never Used  Substance and Sexual Activity  . Alcohol use: Yes    Comment: ocassionally  . Drug use: No  . Sexual activity: Not on file  Lifestyle  . Physical activity:    Days per week: Not on file    Minutes per session: Not on file  . Stress: Not on file  Relationships  . Social connections:    Talks on phone: Not on file    Gets together: Not on file    Attends religious service: Not on file    Active member of club or organization: Not on file    Attends meetings of clubs or organizations: Not on file    Relationship status: Not on file  . Intimate partner violence:    Fear of current or ex partner: Not on file    Emotionally abused: Not on file    Physically abused: Not on file    Forced sexual activity: Not on file  Other Topics Concern  . Not on file  Social History Narrative  . Not on file    Vital Signs: Blood pressure (!) 148/82, pulse 67, resp. rate 16, height 5\' 3"  (1.6 m), weight 143 lb 3.2 oz (65 kg), last menstrual period 09/19/2013, SpO2 98 %.  Examination: General Appearance: The patient is well-developed, well-nourished, and in no distress. Skin: Gross inspection of skin unremarkable. Head: normocephalic, no gross deformities. Eyes: no gross deformities noted. ENT: ears appear grossly normal no exudates. Neck: Supple. No thyromegaly. No LAD. Respiratory: no rhonchi noted. Cardiovascular: Normal S1 and S2 without murmur or rub. Extremities: No cyanosis.  pulses are equal. Neurologic: Alert and oriented. No involuntary movements.  LABS: No results found for this or any previous visit (from the past 2160 hour(s)).  Radiology: US Breast Ltd Uni Left Inc Axilla  Result Date: 04/13/2018 CLINICAL DATA:  Left breast pain, increasing in the last month. EXAM: DIGITAL DIAGNOSTIC LEFT MAMMOGRAM WITH IMPLANTS, CAD AND TOMO ULTRASOUND LEFT BREAST The patient has retropectoral implants. Standard and implant displaced views were performed. COMPARISON:  Previous exam(s). ACR Breast Density Category c: The breast tissue is heterogeneously dense, which may obscure small masses. FINDINGS: Mammographically, there are no suspicious masses, areas of nonsurgical architectural distortion or microcalcifications in the left breast. On physical exam, no suspicious masses are palpated. Shadowing lesions. Targeted ultrasound is performed, showing no suspicious masses or shadowing lesions. Mammographic images were processed with CAD. IMPRESSION: No mammographic sonographic evidence of malignancy in the left breast. RECOMMENDATION: Further management of patient's  left breast pain should based on clinical grounds. Otherwise, bilateral screening mammogram is recommended in March 2020. I have discussed the findings and recommendations with the patient. Results were also provided in writing at the conclusion of the visit. If applicable, a reminder letter will be sent to the patient regarding the next appointment. BI-RADS CATEGORY  2: Benign. Electronically Signed   By: Ted Mcalpine M.D.   On: 04/13/2018 14:55   Mm Diag Breast W/implant Tomo Uni L  Result Date: 04/13/2018 CLINICAL DATA:  Left breast pain, increasing in the last month. EXAM: DIGITAL DIAGNOSTIC LEFT MAMMOGRAM WITH IMPLANTS, CAD AND TOMO ULTRASOUND LEFT BREAST The patient has retropectoral implants. Standard and implant displaced views were performed. COMPARISON:  Previous exam(s). ACR Breast Density Category c: The  breast tissue is heterogeneously dense, which may obscure small masses. FINDINGS: Mammographically, there are no suspicious masses, areas of nonsurgical architectural distortion or microcalcifications in the left breast. On physical exam, no suspicious masses are palpated. Shadowing lesions. Targeted ultrasound is performed, showing no suspicious masses or shadowing lesions. Mammographic images were processed with CAD. IMPRESSION: No mammographic sonographic evidence of malignancy in the left breast. RECOMMENDATION: Further management of patient's left breast pain should based on clinical grounds. Otherwise, bilateral screening mammogram is recommended in March 2020. I have discussed the findings and recommendations with the patient. Results were also provided in writing at the conclusion of the visit. If applicable, a reminder letter will be sent to the patient regarding the next appointment. BI-RADS CATEGORY  2: Benign. Electronically Signed   By: Ted Mcalpine M.D.   On: 04/13/2018 14:55    No results found.  No results found.    Assessment and Plan: Patient Active Problem List   Diagnosis Date Noted  . Mild intermittent asthma without complication 04/06/2018  . Seasonal allergic rhinitis due to pollen 04/06/2018  . Chronic pansinusitis 04/06/2018  . Chronic pelvic pain in female 01/08/2018  . Chronic pain syndrome 07/23/2017  . Left lower quadrant pain 05/27/2017  . Pelvic pain 11/18/2016    1. Asthma 2. Chronic Pansinusitis 3. Chest Pain stable likely related to her breasts and not cardiac in origin. She is following with her primary GYN 4. Allergic rhinitis she completed shots in 2017. Currently controlled but not as well as she has been in the past  General Counseling: I have discussed the findings of the evaluation and examination with Camree.  I have also discussed any further diagnostic evaluation thatmay be needed or ordered today. Carrolyn verbalizes understanding of the  findings of todays visit. We also reviewed her medications today and discussed drug interactions and side effects including but not limited excessive drowsiness and altered mental states. We also discussed that there is always a risk not just to her but also people around her. she has been encouraged to call the office with any questions or concerns that should arise related to todays visit.    Time spent:  I have personally obtained a history, examined the patient, evaluated laboratory and imaging results, formulated the assessment and plan and placed orders.    Yevonne Pax, MD [facility] Pulmonary and Critical Care Sleep medicine

## 2018-08-11 ENCOUNTER — Emergency Department
Admission: EM | Admit: 2018-08-11 | Discharge: 2018-08-11 | Disposition: A | Discharge status: Home / Self Care | Payer: BLUE CROSS/BLUE SHIELD | Attending: Emergency Medicine | Admitting: Emergency Medicine

## 2018-08-11 ENCOUNTER — Encounter: Payer: Self-pay | Admitting: *Deleted

## 2018-08-11 ENCOUNTER — Other Ambulatory Visit: Payer: Self-pay

## 2018-08-11 ENCOUNTER — Emergency Department: Payer: BLUE CROSS/BLUE SHIELD

## 2018-08-11 DIAGNOSIS — R1084 Generalized abdominal pain: Secondary | ICD-10-CM | POA: Diagnosis not present

## 2018-08-11 DIAGNOSIS — J4521 Mild intermittent asthma with (acute) exacerbation: Secondary | ICD-10-CM | POA: Insufficient documentation

## 2018-08-11 DIAGNOSIS — E876 Hypokalemia: Secondary | ICD-10-CM | POA: Diagnosis not present

## 2018-08-11 DIAGNOSIS — R079 Chest pain, unspecified: Secondary | ICD-10-CM

## 2018-08-11 DIAGNOSIS — E039 Hypothyroidism, unspecified: Secondary | ICD-10-CM | POA: Insufficient documentation

## 2018-08-11 DIAGNOSIS — R112 Nausea with vomiting, unspecified: Secondary | ICD-10-CM | POA: Insufficient documentation

## 2018-08-11 DIAGNOSIS — Z79899 Other long term (current) drug therapy: Secondary | ICD-10-CM | POA: Insufficient documentation

## 2018-08-11 LAB — CBC
HEMATOCRIT: 43.6 % (ref 35.0–47.0)
HEMOGLOBIN: 15.1 g/dL (ref 12.0–16.0)
MCH: 33 pg (ref 26.0–34.0)
MCHC: 34.6 g/dL (ref 32.0–36.0)
MCV: 95.3 fL (ref 80.0–100.0)
Platelets: 282 10*3/uL (ref 150–440)
RBC: 4.58 MIL/uL (ref 3.80–5.20)
RDW: 13.2 % (ref 11.5–14.5)
WBC: 8.5 10*3/uL (ref 3.6–11.0)

## 2018-08-11 LAB — HEPATIC FUNCTION PANEL
ALT: 18 U/L (ref 0–44)
AST: 23 U/L (ref 15–41)
Albumin: 5 g/dL (ref 3.5–5.0)
Alkaline Phosphatase: 51 U/L (ref 38–126)
Bilirubin, Direct: 0.1 mg/dL (ref 0.0–0.2)
Indirect Bilirubin: 1.1 mg/dL — ABNORMAL HIGH (ref 0.3–0.9)
TOTAL PROTEIN: 8 g/dL (ref 6.5–8.1)
Total Bilirubin: 1.2 mg/dL (ref 0.3–1.2)

## 2018-08-11 LAB — BASIC METABOLIC PANEL
ANION GAP: 12 (ref 5–15)
BUN: 15 mg/dL (ref 6–20)
CHLORIDE: 94 mmol/L — AB (ref 98–111)
CO2: 32 mmol/L (ref 22–32)
Calcium: 9.3 mg/dL (ref 8.9–10.3)
Creatinine, Ser: 0.85 mg/dL (ref 0.44–1.00)
GFR calc Af Amer: >60 mL/min (ref >60)
GFR calc non Af Amer: >60 mL/min (ref >60)
GLUCOSE: 114 mg/dL — AB (ref 70–99)
POTASSIUM: 2.8 mmol/L — AB (ref 3.5–5.1)
SODIUM: 138 mmol/L (ref 135–145)

## 2018-08-11 LAB — LIPASE, BLOOD: Lipase: 46 U/L (ref 11–51)

## 2018-08-11 LAB — TROPONIN I
Troponin I: <0.03 ng/mL (ref <0.03)
Troponin I: <0.03 ng/mL (ref <0.03)

## 2018-08-11 MED ORDER — POTASSIUM CHLORIDE CRYS ER 20 MEQ PO TBCR
EXTENDED_RELEASE_TABLET | ORAL | Status: AC
  Filled 2018-08-11: qty 1

## 2018-08-11 MED ORDER — ONDANSETRON 4 MG PO TBDP
4.0000 mg | ORAL_TABLET | Freq: Once | ORAL | Status: AC
  Administered 2018-08-11: 4 mg via ORAL
  Filled 2018-08-11: qty 1

## 2018-08-11 MED ORDER — POTASSIUM CHLORIDE CRYS ER 20 MEQ PO TBCR: 20.0000 meq | EXTENDED_RELEASE_TABLET | Freq: Every day | ORAL | 0 refills | Status: DC

## 2018-08-11 MED ORDER — ONDANSETRON 4 MG PO TBDP: ORAL_TABLET | ORAL | 0 refills | Status: DC

## 2018-08-11 MED ORDER — POTASSIUM CHLORIDE CRYS ER 20 MEQ PO TBCR
40.0000 meq | EXTENDED_RELEASE_TABLET | Freq: Once | ORAL | Status: AC
  Administered 2018-08-11: 40 meq via ORAL
  Filled 2018-08-11: qty 2

## 2018-08-11 NOTE — Discharge Instructions (Signed)
Your workup in the Emergency Department today was reassuring.  We did not find any specific abnormalities other than your potassium being low, which is likely the result of both the Lasix you take as well as the vomiting you had today.  Please take the prescribed potassium supplement at least until you can follow-up with your primary care doctor.  We recommend you drink plenty of fluids, take your regular medications and/or any new ones prescribed today, and follow up with the doctor(s) listed in these documents as recommended.  Return to the Emergency Department if you develop new or worsening symptoms that concern you.

## 2018-08-11 NOTE — ED Provider Notes (Signed)
[facility] Emergency Department Provider Note  ____________________________________________   First MD Initiated Contact with Patient 08/11/18 1621     (approximate)  I have reviewed the triage vital signs and the nursing notes.   HISTORY  Chief Complaint Chest Pain and Emesis    HPI Vanessa Chapman is a 47 y.o. female with medical history as listed below who presents for evaluation of acute onset chest pain, shortness of breath, vomiting, and generalized abdominal pain.  Symptoms began while she was at lunch with 14 or 15 other people from work.  She did not become choked or have any other specific incident occur, the symptoms just started abruptly and were very severe and nothing in particular made them better or worse.  She called her husband asked him to bring her to the emergency department because she was afraid she was having a heart attack.  She has had similar episodes in the past that were thought to be due to anxiety or panic attacks but not anything this severe and the pain did not feel the same.  Since coming to the emergency department her symptoms have almost completely resolved except for some persistent abdominal pain and some mild general malaise.  She denies any recent illnesses.  She denies fever/chills, lower abdominal pain, and dysuria.  She takes Lasix in addition to the other medications listed below but cannot remember the dosage.  She denies any history of blood clots in the legs of the lungs.  She denies being on any exogenous estrogen.  No recent trips or immobilizations nor surgeries.  She has a history of hypertension but not of diabetes, hyperlipidemia, tobacco use, and has no first-degree relatives with ACS/CAD.  She says she has been evaluated in the past with scans and ultrasounds to see if she has gallbladder disease but they have been negative.  She has also had stress tests and echoes that have all been reassuring in the past.  She took  half a Xanax prior to coming to the emergency department but it did not seem to help.  Past Medical History:  Diagnosis Date  . Allergic asthma, mild intermittent, uncomplicated   . Depression   . GERD (gastroesophageal reflux disease)   . Heart murmur   . Hypertension   . Hypothyroidism   . Hypothyroidism   . Pelvic pain   . PONV (postoperative nausea and vomiting)   . Wears contact lenses     Patient Active Problem List   Diagnosis Date Noted  . Mild intermittent asthma without complication 04/06/2018  . Seasonal allergic rhinitis due to pollen 04/06/2018  . Chronic pansinusitis 04/06/2018  . Chronic pelvic pain in female 01/08/2018  . Chronic pain syndrome 07/23/2017  . Left lower quadrant pain 05/27/2017  . Pelvic pain 11/18/2016    Past Surgical History:  Procedure Laterality Date  . ABDOMINAL HYSTERECTOMY    . BALLOON SINUPLASTY  10/2015  . BREAST ENHANCEMENT SURGERY  2008  . CESAREAN SECTION  1996  . ENDOMETRIAL ABLATION W/ NOVASURE  01/2015  . EXCISIONAL HEMORRHOIDECTOMY  09/28/2006  . LAPAROSCOPIC VAGINAL HYSTERECTOMY WITH SALPINGECTOMY Bilateral 11/18/2016   Procedure: ATTEMPTED LAPAROSCOPIC ASSISTED VAGINAL HYSTERECTOMY WITH SALPINGECTOMY: LYSIS OF ADHESIONS ABDOMINAL HYSTERECTOMY Elsie SaasWTIY SALPIGECTOMY;  Surgeon: Richarda Overlieichard Holland, MD;  [facility] [place];  Service: Gynecology;  Laterality: Bilateral;  . LUMBAR LAMINECTOMY  12/05/2008   L4 -- L5  . MICROTUBOPLASTY  2009   tubal ligation reversal  . TUBAL LIGATION Bilateral 1998  Prior to Admission medications   Medication Sig Start Date End Date Taking? Authorizing Provider  ALPRAZolam Prudy Feeler) 1 MG tablet Take 1 mg by mouth at bedtime as needed for anxiety.    [provider]  DIVIGEL 1 MG/GM GEL APP ONE PACKET D 06/30/18   [provider]  Eszopiclone 3 MG TABS Take 3 mg by mouth at bedtime as needed for sleep. 09/17/16   [provider]  fluticasone (FLONASE) 50  MCG/ACT nasal spray Place 1 spray into both nostrils as needed for allergies or rhinitis.    [provider]  INTRAROSA 6.5 MG INST INSERT 1 SPPLICATORFUL VAGINALLY AT BEDTIME NIGHTLY 06/21/18   [provider]  ipratropium-albuterol (DUONEB) 0.5-2.5 (3) MG/3ML SOLN Take 3 mLs by nebulization every 6 (six) hours as needed. 04/06/18   Yevonne Pax, MD  levothyroxine (SYNTHROID, LEVOTHROID) 100 MCG tablet Take 100 mcg by mouth daily before breakfast.    [provider]  ondansetron (ZOFRAN ODT) 4 MG disintegrating tablet Allow 1-2 tablets to dissolve in your mouth every 8 hours as needed for nausea/vomiting 08/11/18   Loleta Rose, MD  potassium chloride SA (KLOR-CON M20) 20 MEQ tablet Take 1 tablet (20 mEq total) by mouth daily. 08/11/18   Loleta Rose, MD    Allergies Ibuprofen and Penicillins  Family History  Adopted: Yes  Problem Relation Age of Onset  . Breast cancer Neg Hx     Social History Social History   Tobacco Use  . Smoking status: Never Smoker  . Smokeless tobacco: Never Used  Substance Use Topics  . Alcohol use: Yes    Comment: ocassionally  . Drug use: No    Review of Systems Constitutional: No fever/chills Eyes: No visual changes. ENT: No sore throat. Cardiovascular: Chest pain as listed above Respiratory: Shortness of breath as listed above Gastrointestinal: Abdominal pain with nausea and vomiting as listed above Genitourinary: Negative for dysuria. Musculoskeletal: Negative for neck pain.  Negative for back pain. Integumentary: Negative for rash. Neurological: Negative for headaches, focal weakness or numbness.   ____________________________________________   PHYSICAL EXAM:  VITAL SIGNS: ED Triage Vitals  Enc Vitals Group     BP 08/11/18 1446 (!) 155/92     Pulse Rate 08/11/18 1446 72     Resp 08/11/18 1446 16     Temp 08/11/18 1446 98.1 F (36.7 C)     Temp Source 08/11/18 1446 Oral     SpO2 08/11/18 1446 100 %      Weight 08/11/18 1443 63.5 kg (140 lb)     Height 08/11/18 1443 1.6 m (5\' 3" )     Head Circumference --      Peak Flow --      Pain Score 08/11/18 1443 7     Pain Loc --      Pain Edu? --      Excl. in GC? --     Constitutional: Alert and oriented. Well appearing and in no acute distress. Eyes: Conjunctivae are normal.  Head: Atraumatic. Nose: No congestion/rhinnorhea. Mouth/Throat: Mucous membranes are moist. Neck: No stridor.  No meningeal signs.   Cardiovascular: Normal rate, regular rhythm. Good peripheral circulation. Grossly normal heart sounds. Respiratory: Normal respiratory effort.  No retractions. Lungs CTAB. Gastrointestinal: Soft and nondistended.  Mild diffuse tenderness to palpation throughout the abdomen with no focal tenderness, negative Murphy sign, no tenderness to McBurney's point. Musculoskeletal: No lower extremity tenderness nor edema. No gross deformities of extremities. Neurologic:  Normal speech and language. No  gross focal neurologic deficits are appreciated.  Skin:  Skin is warm, dry and intact. No rash noted. Psychiatric: Mood and affect are quite anxious and nervous, but she is still able to.  Family is at bedside.  ____________________________________________   LABS (all labs ordered are listed, but only abnormal results are displayed)  Labs Reviewed  BASIC METABOLIC PANEL - Abnormal; Notable for the following components:      Result Value   Potassium 2.8 (*)    Chloride 94 (*)    Glucose, Bld 114 (*)    All other components within normal limits  HEPATIC FUNCTION PANEL - Abnormal; Notable for the following components:   Indirect Bilirubin 1.1 (*)    All other components within normal limits  CBC  TROPONIN I  TROPONIN I  LIPASE, BLOOD  POC URINE PREG, ED   ____________________________________________  EKG  ED ECG REPORT I, Loleta Rose, the attending physician, personally viewed and interpreted this ECG.  Date: 08/11/2018 EKG Time: 14:  41 Rate: 69 Rhythm: normal sinus rhythm QRS Axis: normal Intervals: normal ST/T Wave abnormalities: Non-specific ST segment / T-wave changes, but no evidence of acute ischemia. Narrative Interpretation: no evidence of acute ischemia   ____________________________________________  RADIOLOGY I, Loleta Rose, personally viewed and evaluated these images (plain radiographs) as part of my medical decision making, as well as reviewing the written report by the radiologist.  ED MD interpretation: Some scattered peribronchial thickening likely secondary to asthma, no acute abnormality such as infiltrates or pneumothorax  Official radiology report(s): Dg Chest 2 View  Result Date: 08/11/2018 CLINICAL DATA:  Initial evaluation for acute chest pain, pressure, left arm numbness. EXAM: CHEST - 2 VIEW COMPARISON:  Prior radiograph from 04/06/2018. FINDINGS: The cardiac and mediastinal silhouettes are stable in size and contour, and remain within normal limits. The lungs are normally inflated. Mild scattered peribronchial thickening, which could be related to asthma or possibly bronchiolitis. No airspace consolidation, pleural effusion, or pulmonary edema is identified. There is no pneumothorax. No acute osseous abnormality identified. IMPRESSION: 1. Mild scattered peribronchial thickening, suspected to be related history of asthma, although acute bronchiolitis could be considered in the correct clinical setting. No focal infiltrates to suggest pneumonia. 2. No other active cardiopulmonary disease. Electronically Signed   By: Rise Mu M.D.   On: 08/11/2018 15:40    ____________________________________________   PROCEDURES  Critical Care performed: No   Procedure(s) performed:   Procedures   ____________________________________________   INITIAL IMPRESSION / ASSESSMENT AND PLAN / ED COURSE  As part of my medical decision making, I reviewed the following data within the electronic  MEDICAL RECORD NUMBER History obtained from family, Nursing notes reviewed and incorporated, Labs reviewed , EKG interpreted , Old chart reviewed and Radiograph reviewed     Differential diagnosis includes, but is not limited to, panic/anxiety attack, pneumonia, asthma attack, ACS, PE, pneumothorax, biliary colic, esophageal obstruction since the symptoms occurred while eating.  Based on the patient's level of nervousness and anxiety and the description of the symptoms being so severe and then resolving almost completely, I strongly suspect that she had a panic attack although the reasons are unclear.  I had a discussion with her about this after discussing all the other medical possibilities.  Her vital signs have been within normal limits after an initial blood pressure that was elevated while she was still being triaged and was even more nervous.  Her lab work is reassuring with a normal CBC and a normal basic metabolic  panel except for a potassium of 2.8 which is likely related both to the acute vomiting as well as her chronic furosemide.  She is low risk based on HEART score and PERC negative.  After discussing various options, we agreed on a 3-hour recheck of her troponin as well as checking hepatic function tests and lipase.  If all that is within normal limits I think it is appropriate for her to be discharged and follow-up as an outpatient.  I gave her a potassium chloride 40 mEq supplement given her low potassium today and will plan on prescribing her 1 so she can continue on her Lasix.  She and her family understand and agree with the plan.  Clinical Course as of Aug 11 1909  Wed Aug 11, 2018  1610 The patient has had no additional symptoms since coming to the ED.  She reports that she still has some nonspecific generalized abdominal discomfort but has had no more episodes of chest pain, shortness of breath, nor vomiting.  I discussed the results of her normal hepatic function panel, lipase, and  second troponin.  She and her family are comfortable with the plan for discharge and outpatient follow-up.  I will write her a prescription for potassium supplement given that she is on Lasix and encourage close follow-up.  I gave my usual and customary return precautions.  They understand and agree with the plan.   [CF]    Clinical Course User Index [CF] Loleta Rose, MD    ____________________________________________  FINAL CLINICAL IMPRESSION(S) / ED DIAGNOSES  Final diagnoses:  Chest pain, unspecified type  Non-intractable vomiting with nausea, unspecified vomiting type  Generalized abdominal pain  Hypokalemia     MEDICATIONS GIVEN DURING THIS VISIT:  Medications  potassium chloride SA (K-DUR,KLOR-CON) CR tablet 40 mEq (40 mEq Oral Given 08/11/18 1708)  ondansetron (ZOFRAN-ODT) disintegrating tablet 4 mg (4 mg Oral Given 08/11/18 1708)     ED Discharge Orders         Ordered    ondansetron (ZOFRAN ODT) 4 MG disintegrating tablet     08/11/18 1845    potassium chloride SA (KLOR-CON M20) 20 MEQ tablet  Daily     08/11/18 1845           Note:  This document was prepared using Dragon voice recognition software and may include unintentional dictation errors.    Loleta Rose, MD 08/11/18 1910

## 2018-08-11 NOTE — ED Triage Notes (Signed)
Pt to ED reporting sudden onset of left sided chest pressure and left arm numbness this afternoon. Pt had one episode of vomiting and general malaise and weakness since. Pt denies hx of MI but has had echos and stress tests performed without concerns reported. Pt took 1 nitro pill at home without relief and half a xanax also without relief.

## 2018-09-27 ENCOUNTER — Encounter

## 2018-09-27 ENCOUNTER — Ambulatory Visit: Payer: BLUE CROSS/BLUE SHIELD | Admitting: Gastroenterology

## 2018-09-27 ENCOUNTER — Other Ambulatory Visit: Payer: Self-pay

## 2018-09-27 ENCOUNTER — Encounter: Payer: Self-pay | Admitting: Gastroenterology

## 2018-09-27 ENCOUNTER — Encounter: Payer: Self-pay | Admitting: *Deleted

## 2018-09-27 VITALS — BP 166/97 | HR 60 | Resp 17 | Ht 63.0 in | Wt 147.8 lb

## 2018-09-27 DIAGNOSIS — R1084 Generalized abdominal pain: Secondary | ICD-10-CM

## 2018-09-27 DIAGNOSIS — R1013 Epigastric pain: Secondary | ICD-10-CM

## 2018-09-27 DIAGNOSIS — G8929 Other chronic pain: Secondary | ICD-10-CM | POA: Diagnosis not present

## 2018-09-27 DIAGNOSIS — R109 Unspecified abdominal pain: Secondary | ICD-10-CM

## 2018-09-27 NOTE — Progress Notes (Signed)
Vanessa Repress, MD [address]  [address]  [place], [place] [address]  Main: [phone]  Fax: [phone]    Gastroenterology Consultation  Referring Provider:     Alan Mulder, MD Primary Care Physician:  Alan Mulder, MD Primary Gastroenterologist:  Dr. Servando Snare Reason for Consultation:     Dyspepsia        HPI:   Vanessa Chapman is a 47 y.o. female referred by Dr. Patrecia Pace, Delsa Sale, MD  for consultation & management of 2 months history of severe epigastric pain, sharp associated with nausea, vomiting, abdominal bloating.  Patient has history of hypertension, hypothyroidism, anxiety. On 08/11/2018, patient suddenly experienced left-sided chest pressure, vomiting, epigastric pain when she was at work, patient thought she had heart attack. She went to ER at that time.  In the ER, patient underwent EKG and troponins were negative.  Her CBC, lipase, LFTs, BMP were unremarkable.  Since then, patient continues to experience constant epigastric pain associated with nausea and vomiting, postprandial, severe bloating.  Her PCP did work-up including H. pylori serologies, IgM was elevated, IgG normal, nuclear medicine gallbladder emptying study which revealed 35% ejection fraction of the gallbladder.  She was started on H. pylori treatment with amoxicillin, metronidazole and Prilosec for 30 days. Initially, she experienced severe diarrhea on antibiotics and could not tolerate metronidazole.  This was switched to ciprofloxacin and Prilosec increased to 40 mg twice daily.  She is also started on Reglan 10 mg twice daily due to intractable nausea and emesis.  Currently, she is having one formed bowel movement daily. Patient reports that she is able to tolerate only high carb diet.  She lost about 15 pounds initially, now she is able to gain her weight back on high carb diet.  She also had an ultrasound abdomen which was unremarkable.  Due to persistent symptoms, patient is referred  to GI for further evaluation.  She was told by her PCP that she might need her gallbladder to be removed because the ejection fraction decreased from 60% to 35% the last 2 years.  She thinks her symptoms might be related to biliary dyskinesia.  Patient is frustrated with ongoing symptoms because she has to take off from work for several days in the last 2 months.  Patient is accompanied by her husband today.  She reports that her personal life is generally stressful taking care of elderly parents.  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  Patient does drink sodas, uses artificial sweeteners in coffee  Of note, per chart review, patient was seen by Dr. Servando Snare in 2015 for similar presentation of symptoms including epigastric pain, regurgitation, nausea and vomiting.  Reportedly, she was suffering from these symptoms since 2014. She had extensive work-up including CT chest and abdomen, ultrasound abdomen, upper endoscopy, upper GI series which were all unremarkable other than evidence of focal intestinal metaplasia and no evidence of H. pylori.  Her H. pylori serologies came back negative as well.  She was given Prilosec which helped with her reflux symptoms but epigastric pain persisted.  She was given hyoscyamine, Flexeril at that time.  She said she was doing fairly well until recently since that episode in 2015  NSAIDs: None  Antiplts/Anticoagulants/Anti thrombotics: None  GI Procedures: EGD by Dr. Servando Snare on 01/26/2014 Esophagus was normal, localized minimal inflammation, erythema in gastric antrum, biopsies were performed.  The examined duodenum was normal Biopsies revealed erosive gastritis with focal intestinal metaplasia, negative for  dysplasia and malignancy.  Negative for H. pylori by IHC stain  Past Medical History:  Diagnosis Date  . Allergic asthma, mild intermittent, uncomplicated   . Depression   . GERD (gastroesophageal reflux disease)   . Heart murmur   . Hypertension   .  Hypothyroidism   . Hypothyroidism   . Pelvic pain   . PONV (postoperative nausea and vomiting)   . Wears contact lenses     Past Surgical History:  Procedure Laterality Date  . ABDOMINAL HYSTERECTOMY    . BACK SURGERY    . BALLOON SINUPLASTY  10/2015  . BREAST ENHANCEMENT SURGERY  2008  . CESAREAN SECTION  1996  . ENDOMETRIAL ABLATION W/ NOVASURE  01/2015  . EXCISIONAL HEMORRHOIDECTOMY  09/28/2006  . LAPAROSCOPIC VAGINAL HYSTERECTOMY WITH SALPINGECTOMY Bilateral 11/18/2016   Procedure: ATTEMPTED LAPAROSCOPIC ASSISTED VAGINAL HYSTERECTOMY WITH SALPINGECTOMY: LYSIS OF ADHESIONS ABDOMINAL HYSTERECTOMY Elsie Saas SALPIGECTOMY;  Surgeon: Richarda Overlie, MD;  Location: [facility];  Service: Gynecology;  Laterality: Bilateral;  . LUMBAR LAMINECTOMY  12/05/2008   L4 -- L5  . MICROTUBOPLASTY  2009   tubal ligation reversal  . TUBAL LIGATION Bilateral 1998    Current Outpatient Medications:  .  ALPRAZolam (XANAX) 1 MG tablet, Take 1 mg by mouth at bedtime as needed for anxiety., Disp: , Rfl:  .  amoxicillin (AMOXIL) 875 MG tablet, TK 1 T PO BID, Disp: , Rfl: 0 .  ciprofloxacin (CIPRO) 250 MG tablet, TK 1 T PO BID, Disp: , Rfl: 0 .  DIVIGEL 1 MG/GM GEL, APP ONE PACKET D, Disp: , Rfl: 6 .  Eszopiclone 3 MG TABS, Take 3 mg by mouth at bedtime as needed for sleep., Disp: , Rfl: 1 .  fluticasone (FLONASE) 50 MCG/ACT nasal spray, Place 1 spray into both nostrils as needed for allergies or rhinitis., Disp: , Rfl:  .  hydrochlorothiazide (HYDRODIURIL) 12.5 MG tablet, Take 12.5 mg by mouth daily., Disp: , Rfl: 2 .  INTRAROSA 6.5 MG INST, INSERT 1 SPPLICATORFUL VAGINALLY AT BEDTIME NIGHTLY, Disp: , Rfl: 3 .  ipratropium-albuterol (DUONEB) 0.5-2.5 (3) MG/3ML SOLN, Take 3 mLs by nebulization every 6 (six) hours as needed., Disp: 360 mL, Rfl: 4 .  levothyroxine (SYNTHROID, LEVOTHROID) 100 MCG tablet, Take 100 mcg by mouth daily before breakfast., Disp: , Rfl:  .  lidocaine (LIDODERM) 5 %,  PLACE 1 PATCH ONTO THE SKIN Q 12 H. REMOVE AND DISCARD PATCH WITHIN 12 HOURS OR UTD, Disp: , Rfl: 2 .  lidocaine (XYLOCAINE) 5 % ointment, APP EXT AA PRN, Disp: , Rfl: 2 .  meloxicam (MOBIC) 15 MG tablet, TK 1 T PO QAM, Disp: , Rfl: 2 .  metoCLOPramide (REGLAN) 10 MG tablet, TK 1 T PO BID, Disp: , Rfl: 0 .  metoprolol succinate (TOPROL-XL) 25 MG 24 hr tablet, Take 12.5 mg by mouth daily., Disp: , Rfl: 3 .  omeprazole (PRILOSEC) 40 MG capsule, , Disp: , Rfl: 3 .  ondansetron (ZOFRAN ODT) 4 MG disintegrating tablet, Allow 1-2 tablets to dissolve in your mouth every 8 hours as needed for nausea/vomiting, Disp: 30 tablet, Rfl: 0 .  potassium chloride SA (KLOR-CON M20) 20 MEQ tablet, Take 1 tablet (20 mEq total) by mouth daily., Disp: 7 tablet, Rfl: 0 .  promethazine (PHENERGAN) 12.5 MG suppository, UNW AND I 1 SUP REC Q 4 TO 6 H PRN, Disp: , Rfl: 0 .  traMADol (ULTRAM) 50 MG tablet, TK 1 T PO Q 6 H PRN, Disp: , Rfl: 0 .  metoprolol succinate (TOPROL-XL) 100 MG 24 hr tablet, Take by mouth., Disp: , Rfl:  .  Oxcarbazepine (TRILEPTAL) 300 MG tablet, INCREASE BY TAKING 1 tablet by mouth every morning 1 tablet by mouth AT NOON and 3 tablets by mouth at bedtime, Disp: , Rfl:  .  Potassium Chloride ER 20 MEQ TBCR, TAKE 1 TABLET PO ONCE DAILY, Disp: , Rfl: 0   Family History  Adopted: Yes  Problem Relation Age of Onset  . Breast cancer Neg Hx      Social History   Tobacco Use  . Smoking status: Never Smoker  . Smokeless tobacco: Never Used  Substance Use Topics  . Alcohol use: Yes    Comment: ocassionally  . Drug use: No    Allergies as of 09/27/2018 - Review Complete 09/27/2018  Allergen Reaction Noted  . Ibuprofen Nausea And Vomiting 11/14/2016  . Penicillins Hives 11/01/2015    Review of Systems:    All systems reviewed and negative except where noted in HPI.   Physical Exam:  BP (!) 166/97 (BP Location: Left Arm, Patient Position: Sitting, Cuff Size: Normal)   Pulse 60   Resp 17    Ht 5\' 3"  (1.6 m)   Wt 147 lb 12.8 oz (67 kg)   LMP 09/19/2013   BMI 26.18 kg/m  Patient's last menstrual period was 09/19/2013.  General:   Alert,  Well-developed, well-nourished, pleasant and cooperative in NAD Head:  Normocephalic and atraumatic. Eyes:  Sclera clear, no icterus.   Conjunctiva pink. Ears:  Normal auditory acuity. Nose:  No deformity, discharge, or lesions. Mouth:  No deformity or lesions,oropharynx pink & moist. Neck:  Supple; no masses or thyromegaly. Lungs:  Respirations even and unlabored.  Clear throughout to auscultation.   No wheezes, crackles, or rhonchi. No acute distress. Heart:  Regular rate and rhythm; no murmurs, clicks, rubs, or gallops. Abdomen:  Normal bowel sounds. Soft, epigastric tenderness, distended, tympanic to percussion and non-distended without masses, hepatosplenomegaly or hernias noted.  No guarding or rebound tenderness.   Rectal: Not performed Msk:  Symmetrical without gross deformities. Good, equal movement & strength bilaterally. Pulses:  Normal pulses noted. Extremities:  No clubbing or edema.  No cyanosis. Neurologic:  Alert and oriented x3;  grossly normal neurologically. Skin:  Intact without significant lesions or rashes. No jaundice. Lymph Nodes:  No significant cervical adenopathy. Psych:  Alert and cooperative. Normal mood and affect.  Imaging Studies: Reviewed  Assessment and Plan:   Vanessa Chapman is a 47 y.o. Caucasian female with history of anxiety, hypothyroidism, hypertension, hemorrhoidectomy, hysterectomy with flareup of dyspepsia for the last 2 months.  Patient had extensive work-up in the past including blood test, endoscopy evaluation as well as imaging studies which were negative.  Her history is highly consistent with functional dyspepsia. During the current flareup, she had repeat H. pylori serologies, ultrasound abdomen which were negative.  H. pylori IgM is unreliable and not diagnostic for the presence  of H. pylori infection.  Therefore, I recommend patient to discontinue all antibiotics.  I advised her to stop Reglan, continue Zofran and Phenergan only.  She will continue Prilosec 40 mg twice daily.  Advised her to avoid carbonated beverages, artificial sweeteners.  With regards to biliary dyskinesia, she thinks her gallbladder need to be removed and her symptoms are probably related to the delayed emptying of gallbladder.  However, I reiterated that the test is not diagnostic and does not correlate with her symptoms.  She wants to be referred  to general surgery for surgical opinion which I did today.  With history of focal intestinal metaplasia, will perform upper endoscopy with gastric mapping and duodenal biopsies to rule out celiac disease.  I discussed with her, that her symptoms are functional and stress and anxiety are playing a predominant role in her GI symptoms.  She said she tried low dose tricyclic antidepressants in the past but resulted in severe lethargy.  She is currently taking Xanax as needed for anxiety.  I strongly think her anxiety needs to be well controlled and she will benefit from seeing a psychologist.  I will discuss with her about further management of her anxiety after upper endoscopy  I spent more than 50 minutes of my encounter today in counseling the patient about her condition, various management options   Follow up after the EGD results   Vanessa Repress, MD

## 2018-09-28 ENCOUNTER — Telehealth: Payer: Self-pay | Admitting: Gastroenterology

## 2018-09-28 ENCOUNTER — Ambulatory Visit: Payer: BLUE CROSS/BLUE SHIELD | Admitting: Anesthesiology

## 2018-09-28 ENCOUNTER — Encounter: Payer: Self-pay | Admitting: Internal Medicine

## 2018-09-28 ENCOUNTER — Encounter: Payer: Self-pay | Admitting: *Deleted

## 2018-09-28 ENCOUNTER — Encounter
Admission: RE | Disposition: A | Discharge status: Home / Self Care | Payer: Self-pay | Source: Ambulatory Visit | Attending: Gastroenterology

## 2018-09-28 ENCOUNTER — Ambulatory Visit
Admission: RE | Admit: 2018-09-28 | Discharge: 2018-09-28 | Disposition: A | Discharge status: Home / Self Care | Payer: BLUE CROSS/BLUE SHIELD | Source: Ambulatory Visit | Attending: Gastroenterology | Admitting: Gastroenterology

## 2018-09-28 ENCOUNTER — Encounter: Payer: Self-pay | Admitting: Gastroenterology

## 2018-09-28 ENCOUNTER — Other Ambulatory Visit: Payer: Self-pay

## 2018-09-28 DIAGNOSIS — J4520 Mild intermittent asthma, uncomplicated: Secondary | ICD-10-CM | POA: Diagnosis not present

## 2018-09-28 DIAGNOSIS — K2950 Unspecified chronic gastritis without bleeding: Secondary | ICD-10-CM | POA: Insufficient documentation

## 2018-09-28 DIAGNOSIS — E039 Hypothyroidism, unspecified: Secondary | ICD-10-CM | POA: Diagnosis not present

## 2018-09-28 DIAGNOSIS — Z79890 Hormone replacement therapy (postmenopausal): Secondary | ICD-10-CM | POA: Diagnosis not present

## 2018-09-28 DIAGNOSIS — I10 Essential (primary) hypertension: Secondary | ICD-10-CM | POA: Diagnosis not present

## 2018-09-28 DIAGNOSIS — K2960 Other gastritis without bleeding: Secondary | ICD-10-CM | POA: Diagnosis not present

## 2018-09-28 DIAGNOSIS — K219 Gastro-esophageal reflux disease without esophagitis: Secondary | ICD-10-CM | POA: Diagnosis not present

## 2018-09-28 DIAGNOSIS — Z79899 Other long term (current) drug therapy: Secondary | ICD-10-CM | POA: Insufficient documentation

## 2018-09-28 DIAGNOSIS — R1013 Epigastric pain: Secondary | ICD-10-CM | POA: Diagnosis not present

## 2018-09-28 DIAGNOSIS — R109 Unspecified abdominal pain: Secondary | ICD-10-CM

## 2018-09-28 HISTORY — PX: ESOPHAGOGASTRODUODENOSCOPY (EGD) WITH PROPOFOL: SHX5813

## 2018-09-28 SURGERY — ESOPHAGOGASTRODUODENOSCOPY (EGD) WITH PROPOFOL
Anesthesia: General

## 2018-09-28 MED ORDER — LIDOCAINE HCL (PF) 2 % IJ SOLN
INTRAMUSCULAR | Status: AC
  Filled 2018-09-28: qty 10

## 2018-09-28 MED ORDER — PROPOFOL 500 MG/50ML IV EMUL
INTRAVENOUS | Status: AC
  Filled 2018-09-28: qty 50

## 2018-09-28 MED ORDER — GLYCOPYRROLATE 0.2 MG/ML IJ SOLN
INTRAMUSCULAR | Status: AC
  Filled 2018-09-28: qty 1

## 2018-09-28 MED ORDER — SODIUM CHLORIDE 0.9 % IV SOLN
INTRAVENOUS | Status: DC
  Administered 2018-09-28: 13:00:00 via INTRAVENOUS

## 2018-09-28 MED ORDER — LIDOCAINE 2% (20 MG/ML) 5 ML SYRINGE
INTRAMUSCULAR | Status: DC | PRN
  Administered 2018-09-28: 100 mg via INTRAVENOUS

## 2018-09-28 MED ORDER — PROPOFOL 10 MG/ML IV BOLUS
INTRAVENOUS | Status: DC | PRN
  Administered 2018-09-28: 70 mg via INTRAVENOUS
  Administered 2018-09-28: 30 mg via INTRAVENOUS

## 2018-09-28 MED ORDER — PROPOFOL 500 MG/50ML IV EMUL
INTRAVENOUS | Status: DC | PRN
  Administered 2018-09-28: 200 ug/kg/min via INTRAVENOUS

## 2018-09-28 MED ORDER — GI COCKTAIL ~~LOC~~: 30.0000 mL | Freq: Two times a day (BID) | ORAL | 0 refills | Status: AC

## 2018-09-28 MED ORDER — GLYCOPYRROLATE 0.2 MG/ML IJ SOLN
INTRAMUSCULAR | Status: DC | PRN
  Administered 2018-09-28: 0.2 mg via INTRAVENOUS

## 2018-09-28 NOTE — H&P (Signed)
[name], MD [address]  [address]  [place], [place] [address]  Main: [phone]  Fax: [phone] Pager: [phone]  Primary Care Physician:  Alan Mulder, MD Primary Gastroenterologist:  Dr. [name]  Pre-Procedure History & Physical: HPI:  Vanessa Chapman is a 47 y.o. female is here for an endoscopy.   Past Medical History:  Diagnosis Date  . Allergic asthma, mild intermittent, uncomplicated   . Depression   . GERD (gastroesophageal reflux disease)   . Heart murmur   . Hypertension   . Hypothyroidism   . Hypothyroidism   . Pelvic pain   . PONV (postoperative nausea and vomiting)   . Wears contact lenses     Past Surgical History:  Procedure Laterality Date  . ABDOMINAL HYSTERECTOMY    . BACK SURGERY    . BALLOON SINUPLASTY  10/2015  . BREAST ENHANCEMENT SURGERY  2008  . BREAST SURGERY    . CESAREAN SECTION  1996  . ENDOMETRIAL ABLATION W/ NOVASURE  01/2015  . EXCISIONAL HEMORRHOIDECTOMY  09/28/2006  . LAPAROSCOPIC VAGINAL HYSTERECTOMY WITH SALPINGECTOMY Bilateral 11/18/2016   Procedure: ATTEMPTED LAPAROSCOPIC ASSISTED VAGINAL HYSTERECTOMY WITH SALPINGECTOMY: LYSIS OF ADHESIONS ABDOMINAL HYSTERECTOMY Elsie Saas SALPIGECTOMY;  Surgeon: Richarda Overlie, MD;  Location: [facility];  Service: Gynecology;  Laterality: Bilateral;  . LUMBAR LAMINECTOMY  12/05/2008   L4 -- L5  . MICROTUBOPLASTY  2009   tubal ligation reversal  . TUBAL LIGATION Bilateral 1998    Prior to Admission medications   Medication Sig Start Date End Date Taking? Authorizing Provider  hydrochlorothiazide (HYDRODIURIL) 12.5 MG tablet Take 12.5 mg by mouth daily. 08/06/18  Yes [provider]  levothyroxine (SYNTHROID, LEVOTHROID) 100 MCG tablet Take 100 mcg by mouth daily before breakfast.   Yes [provider]  meloxicam (MOBIC) 15 MG tablet TK 1 T PO QAM 09/19/18  Yes [provider]  metoprolol succinate (TOPROL-XL) 25 MG 24  hr tablet Take 12.5 mg by mouth daily. 09/08/18  Yes [provider]  ALPRAZolam Prudy Feeler) 1 MG tablet Take 1 mg by mouth at bedtime as needed for anxiety.    [provider]  amoxicillin (AMOXIL) 875 MG tablet TK 1 T PO BID 09/22/18   [provider]  ciprofloxacin (CIPRO) 250 MG tablet TK 1 T PO BID 09/22/18   [provider]  DIVIGEL 1 MG/GM GEL APP ONE PACKET D 06/30/18   [provider]  Eszopiclone 3 MG TABS Take 3 mg by mouth at bedtime as needed for sleep. 09/17/16   [provider]  fluticasone (FLONASE) 50 MCG/ACT nasal spray Place 1 spray into both nostrils as needed for allergies or rhinitis.    [provider]  INTRAROSA 6.5 MG INST INSERT 1 SPPLICATORFUL VAGINALLY AT BEDTIME NIGHTLY 06/21/18   [provider]  ipratropium-albuterol (DUONEB) 0.5-2.5 (3) MG/3ML SOLN Take 3 mLs by nebulization every 6 (six) hours as needed. 04/06/18   Yevonne Pax, MD  lidocaine (LIDODERM) 5 % PLACE 1 PATCH ONTO THE SKIN Q 12 H. REMOVE AND DISCARD PATCH WITHIN 12 HOURS OR UTD 09/03/18   [provider]  lidocaine (XYLOCAINE) 5 % ointment APP EXT AA PRN 09/23/18   [provider]  metoCLOPramide (REGLAN) 10 MG tablet TK 1 T PO BID 09/06/18   [provider]  metoprolol succinate (TOPROL-XL) 100 MG 24 hr tablet Take by mouth.    [provider]  omeprazole (PRILOSEC) 40 MG capsule  09/10/18  [provider]  ondansetron (ZOFRAN ODT) 4 MG disintegrating tablet Allow 1-2 tablets to dissolve in your mouth every 8 hours as needed for nausea/vomiting 08/11/18   Loleta Rose, MD  Oxcarbazepine (TRILEPTAL) 300 MG tablet INCREASE BY TAKING 1 tablet by mouth every morning 1 tablet by mouth AT NOON and 3 tablets by mouth at bedtime 11/30/17   [provider]  Potassium Chloride ER 20 MEQ TBCR TAKE 1 TABLET PO ONCE DAILY 08/12/18   [provider]  potassium chloride SA (KLOR-CON M20) 20 MEQ  tablet Take 1 tablet (20 mEq total) by mouth daily. 08/11/18   Loleta Rose, MD  promethazine (PHENERGAN) 12.5 MG suppository UNW AND I 1 SUP REC Q 4 TO 6 H PRN 09/23/18   [provider]  traMADol (ULTRAM) 50 MG tablet TK 1 T PO Q 6 H PRN 07/01/18   [provider]    Allergies as of 09/27/2018 - Review Complete 09/27/2018  Allergen Reaction Noted  . Ibuprofen Nausea And Vomiting 11/14/2016  . Penicillins Hives 11/01/2015    Family History  Adopted: Yes  Problem Relation Age of Onset  . Breast cancer Neg Hx     Social History   Socioeconomic History  . Marital status: Married    Spouse name: Not on file  . Number of children: Not on file  . Years of education: Not on file  . Highest education level: Not on file  Occupational History  . Not on file  Social Needs  . Financial resource strain: Not on file  . Food insecurity:    Worry: Not on file    Inability: Not on file  . Transportation needs:    Medical: Not on file    Non-medical: Not on file  Tobacco Use  . Smoking status: Never Smoker  . Smokeless tobacco: Never Used  Substance and Sexual Activity  . Alcohol use: Yes    Comment: ocassionally  . Drug use: No  . Sexual activity: Not on file  Lifestyle  . Physical activity:    Days per week: Not on file    Minutes per session: Not on file  . Stress: Not on file  Relationships  . Social connections:    Talks on phone: Not on file    Gets together: Not on file    Attends religious service: Not on file    Active member of club or organization: Not on file    Attends meetings of clubs or organizations: Not on file    Relationship status: Not on file  . Intimate partner violence:    Fear of current or ex partner: Not on file    Emotionally abused: Not on file    Physically abused: Not on file    Forced sexual activity: Not on file  Other Topics Concern  . Not on file  Social History Narrative  . Not on file    Review of Systems: See  HPI, otherwise negative ROS  Physical Exam: BP 118/73   Pulse 74   Temp (!) 97 F (36.1 C) (Tympanic)   Resp 19   Ht 5\' 3"  (1.6 m)   Wt 65.8 kg   LMP 09/19/2013   SpO2 95%   BMI 25.69 kg/m  General:   Alert,  pleasant and cooperative in NAD Head:  Normocephalic and atraumatic. Neck:  Supple; no masses or thyromegaly. Lungs:  Clear throughout to auscultation.    Heart:  Regular rate and rhythm. Abdomen:  Soft, nontender  and nondistended. Normal bowel sounds, without guarding, and without rebound.   Neurologic:  Alert and  oriented x4;  grossly normal neurologically.  Impression/Plan: Kateri Mc is here for an endoscopy to be performed for epigastric pain  Risks, benefits, limitations, and alternatives regarding  endoscopy have been reviewed with the patient.  Questions have been answered.  All parties agreeable.   Lannette Donath, MD  09/28/2018, 1:52 PM

## 2018-09-28 NOTE — Anesthesia Postprocedure Evaluation (Signed)
Anesthesia Post Note  Patient: Vanessa Chapman  Procedure(s) Performed: ESOPHAGOGASTRODUODENOSCOPY (EGD) WITH PROPOFOL (N/A )  Patient location during evaluation: Endoscopy Anesthesia Type: General Level of consciousness: awake and alert Pain management: pain level controlled Vital Signs Assessment: post-procedure vital signs reviewed and stable Respiratory status: spontaneous breathing, nonlabored ventilation, respiratory function stable and patient connected to nasal cannula oxygen Cardiovascular status: blood pressure returned to baseline and stable Postop Assessment: no apparent nausea or vomiting Anesthetic complications: no     Last Vitals:  Vitals:   09/28/18 1359 09/28/18 1409  BP: (!) 143/92 (!) 147/88  Pulse: 72 73  Resp: 18 14  Temp:    SpO2: 97% 99%    Last Pain:  Vitals:   09/28/18 1409  TempSrc:   PainSc: 0-No pain                 Cleda Mccreedy Dakai Braithwaite

## 2018-09-28 NOTE — Telephone Encounter (Signed)
Shane from [facility] left vm in regards to rx he received  He would like to discuss what he has in stock and what he can make please call [phone]

## 2018-09-28 NOTE — Transfer of Care (Signed)
Immediate Anesthesia Transfer of Care Note  Patient: Vanessa Chapman  Procedure(s) Performed: ESOPHAGOGASTRODUODENOSCOPY (EGD) WITH PROPOFOL (N/A )  Patient Location: Endoscopy Unit  Anesthesia Type:General  Level of Consciousness: sedated  Airway & Oxygen Therapy: Patient connected to nasal cannula oxygen  Post-op Assessment: Post -op Vital signs reviewed and stable  Post vital signs: stable  Last Vitals:  Vitals Value Taken Time  BP    Temp    Pulse 73 09/28/2018  1:49 PM  Resp 26 09/28/2018  1:49 PM  SpO2 95 % 09/28/2018  1:49 PM  Vitals shown include unvalidated device data.  Last Pain:  Vitals:   09/28/18 1247  TempSrc: Oral  PainSc: 5          Complications: No apparent anesthesia complications

## 2018-09-28 NOTE — Anesthesia Post-op Follow-up Note (Signed)
Anesthesia [facility] form completed.        

## 2018-09-28 NOTE — Op Note (Signed)
[facility] Gastroenterology Patient Name: Vanessa Chapman Procedure Date: 09/28/2018 1:12 PM MRN: [id number] Account #: [id number] Date of Birth: 06-May-1971 Admit Type: Outpatient Age: 47 Room: [facility] ENDO ROOM 4 Gender: Female Note Status: Finalized Procedure:            Upper GI endoscopy Indications:          Epigastric abdominal pain, Dyspepsia Providers:            Lin Landsman MD, MD Referring MD:         Lenard Simmer, MD (Referring MD) Medicines:            Monitored Anesthesia Care Complications:        No immediate complications. Estimated blood loss:                        Minimal. Procedure:            Pre-Anesthesia Assessment:                       - Prior to the procedure, a History and Physical was                        performed, and patient medications and allergies were                        reviewed. The patient is competent. The risks and                        benefits of the procedure and the sedation options and                        risks were discussed with the patient. All questions                        were answered and informed consent was obtained.                        Patient identification and proposed procedure were                        verified by the physician, the nurse, the                        anesthesiologist, the anesthetist and the technician in                        the pre-procedure area in the procedure room in the                        endoscopy suite. Mental Status Examination: alert and                        oriented. Airway Examination: normal oropharyngeal                        airway and neck mobility. Respiratory Examination:                        clear to auscultation. CV Examination: normal.  Prophylactic Antibiotics: The patient does not require                        prophylactic antibiotics. Prior Anticoagulants: The                        patient has taken no  previous anticoagulant or                        antiplatelet agents. ASA Grade Assessment: II - A                        patient with mild systemic disease. After reviewing the                        risks and benefits, the patient was deemed in                        satisfactory condition to undergo the procedure. The                        anesthesia plan was to use monitored anesthesia care                        (MAC). Immediately prior to administration of                        medications, the patient was re-assessed for adequacy                        to receive sedatives. The heart rate, respiratory rate,                        oxygen saturations, blood pressure, adequacy of                        pulmonary ventilation, and response to care were                        monitored throughout the procedure. The physical status                        of the patient was re-assessed after the procedure.                       After obtaining informed consent, the endoscope was                        passed under direct vision. Throughout the procedure,                        the patient's blood pressure, pulse, and oxygen                        saturations were monitored continuously. The Endoscope                        was introduced through the mouth, and advanced to the  third part of duodenum. The upper GI endoscopy was                        accomplished without difficulty. The patient tolerated                        the procedure well. Findings:      The duodenal bulb, second portion of the duodenum and third portion of       the duodenum were normal. Biopsies for histology were taken with a cold       forceps for evaluation of celiac disease.      The entire examined stomach was normal. Gastric mapping was performed,       Biopsies were taken with a cold forceps for histology in separate jars.      Esophagogastric landmarks were identified: the  gastroesophageal junction       was found at 36 cm from the incisors.      The gastroesophageal junction and examined esophagus were normal. Impression:           - Normal duodenal bulb, second portion of the duodenum                        and third portion of the duodenum. Biopsied.                       - Normal stomach. Biopsied.                       - Esophagogastric landmarks identified.                       - Normal gastroesophageal junction and esophagus. Recommendation:       - Await pathology results.                       - Discharge patient to home (with spouse).                       - Resume previous diet today.                       - Continue present medications.                       - Return to my office in 4 weeks. Procedure Code(s):    --- Professional ---                       [phone], Esophagogastroduodenoscopy, flexible, transoral;                        with biopsy, single or multiple Diagnosis Code(s):    --- Professional ---                       R10.13, Epigastric pain CPT copyright 2018 American Medical Association. All rights reserved. The codes documented in this report are preliminary and upon coder review may  be revised to meet current compliance requirements. Dr. Ulyess Mort Lin Landsman MD, MD 09/28/2018 1:47:17 PM This report has been signed electronically. Number of Addenda: 0 Note Initiated On: 09/28/2018 1:12 PM      [facility]

## 2018-09-28 NOTE — Anesthesia Preprocedure Evaluation (Signed)
Anesthesia Evaluation  Patient identified by MRN, date of birth, ID band Patient awake    Reviewed: Allergy & Precautions, NPO status , Patient's Chart, lab work & pertinent test results  History of Anesthesia Complications (+) PONV and history of anesthetic complications  Airway Mallampati: II  TM Distance: >3 FB Neck ROM: Full    Dental  (+) Implants   Pulmonary asthma ,    breath sounds clear to auscultation- rhonchi (-) wheezing      Cardiovascular Exercise Tolerance: Good hypertension, Pt. on medications (-) CAD, (-) Past MI, (-) Cardiac Stents and (-) CABG  Rhythm:Regular Rate:Normal - Systolic murmurs and - Diastolic murmurs    Neuro/Psych PSYCHIATRIC DISORDERS Depression negative neurological ROS     GI/Hepatic Neg liver ROS, GERD  ,  Endo/Other  neg diabetesHypothyroidism   Renal/GU negative Renal ROS     Musculoskeletal negative musculoskeletal ROS (+)   Abdominal (+) - obese,   Peds  Hematology negative hematology ROS (+)   Anesthesia Other Findings Past Medical History: No date: Allergic asthma, mild intermittent, uncomplicated No date: Depression No date: GERD (gastroesophageal reflux disease) No date: Heart murmur No date: Hypertension No date: Hypothyroidism No date: Hypothyroidism No date: Pelvic pain No date: PONV (postoperative nausea and vomiting) No date: Wears contact lenses   Reproductive/Obstetrics                             Anesthesia Physical Anesthesia Plan  ASA: II  Anesthesia Plan: General   Post-op Pain Management:    Induction: Intravenous  PONV Risk Score and Plan: 3 and Propofol infusion  Airway Management Planned: Natural Airway  Additional Equipment:   Intra-op Plan:   Post-operative Plan:   Informed Consent: I have reviewed the patients History and Physical, chart, labs and discussed the procedure including the risks, benefits  and alternatives for the proposed anesthesia with the patient or authorized representative who has indicated his/her understanding and acceptance.   Dental advisory given  Plan Discussed with: CRNA and Anesthesiologist  Anesthesia Plan Comments:         Anesthesia Quick Evaluation

## 2018-09-28 NOTE — Telephone Encounter (Signed)
Pharmacy would like to speak with you concerning this prescription  Shane from [facility] left vm in regards to rx he received  He would like to discuss what he has in stock and what he can make please call [phone]

## 2018-09-29 NOTE — Telephone Encounter (Signed)
Can you ask them what they have?  Thanks  RV

## 2018-09-29 NOTE — Telephone Encounter (Signed)
Please disregard this message, pharmacy has resolved this matter and pt has GI cocktail   Thank you TG

## 2018-09-30 LAB — SURGICAL PATHOLOGY

## 2018-10-01 ENCOUNTER — Other Ambulatory Visit: Payer: Self-pay

## 2018-10-01 ENCOUNTER — Ambulatory Visit: Admission: RE | Admit: 2018-10-01 | Payer: BLUE CROSS/BLUE SHIELD | Source: Ambulatory Visit

## 2018-10-01 DIAGNOSIS — R109 Unspecified abdominal pain: Secondary | ICD-10-CM

## 2018-10-04 ENCOUNTER — Other Ambulatory Visit: Payer: Self-pay

## 2018-10-04 ENCOUNTER — Encounter: Payer: Self-pay | Admitting: Surgery

## 2018-10-04 ENCOUNTER — Ambulatory Visit: Payer: BLUE CROSS/BLUE SHIELD | Admitting: Surgery

## 2018-10-04 VITALS — BP 165/98 | HR 58 | Temp 97.8°F | Resp 18 | Ht 63.0 in | Wt 147.0 lb

## 2018-10-04 DIAGNOSIS — K828 Other specified diseases of gallbladder: Secondary | ICD-10-CM

## 2018-10-04 NOTE — Progress Notes (Addendum)
Patient ID: Vanessa Chapman, female   DOB: 11/25/1971, 47 y.o.   MRN: [id number]  HPI Vanessa Chapman is a 47 y.o. female pain in consultation at the request of Dr. Dr. Morayati.  Reports a history of around 2-1/2-month of epigastric pain that is sharp and severe.  Associated with nausea and abdominal bloating.  She reports the pain is worse after eating about 45 minutes.  There is no specific alleviating factors.  No history of diarrhea no history of weight loss. Have an upper endoscopy by Dr. Vanga last week and I have personally reviewed the images.  There is no abnormal  specific findings.  Also she had a previous work-up by his primary care physician doing a HIDA scan showing an ejection fraction of 35%.  Of note the patient reports significant pain after the HIDA scan.  The only thing that relieves the patient temporarily is some GI cocktail. Has a pending CT scan in a couple of days.  He did have an ultrasound last month showing a normal gallbladder with a normal common bile duct.  No evidence of gallstones. She had a previous history of abdominal hysterectomy with some mild chronic pain on the left side of the scar.  Have back surgery several years ago.  She takes tramadol as needed.  LFTs are completely normal including a lipase and a CBC.  A recent chest x-ray that I personally reviewed showing no active cardiopulmonary disease. I had review all the records extensively from Dr. Morayati.  He also had some gastric emptying studies showing very mild decrease in transit. She is able to perform more than 4 METS of activity without any shortness of breath or chest pain.  She is adopted so she does not have full access to for family history.  HPI  Past Medical History:  Diagnosis Date  . Allergic asthma, mild intermittent, uncomplicated   . Depression   . GERD (gastroesophageal reflux disease)   . Heart murmur   . Hypertension   . Hypothyroidism   . Hypothyroidism   . Pelvic  pain   . PONV (postoperative nausea and vomiting)   . Wears contact lenses     Past Surgical History:  Procedure Laterality Date  . ABDOMINAL HYSTERECTOMY    . BACK SURGERY    . BALLOON SINUPLASTY  10/2015  . BREAST ENHANCEMENT SURGERY  2008  . BREAST SURGERY    . CESAREAN SECTION  1996  . ENDOMETRIAL ABLATION W/ NOVASURE  01/2015  . ESOPHAGOGASTRODUODENOSCOPY (EGD) WITH PROPOFOL N/A 09/28/2018   Procedure: ESOPHAGOGASTRODUODENOSCOPY (EGD) WITH PROPOFOL;  Surgeon: Chapman, Vanessa Reddy, MD;  Location: [facility] ENDOSCOPY;  Service: Gastroenterology;  Laterality: N/A;  . EXCISIONAL HEMORRHOIDECTOMY  09/28/2006  . LAPAROSCOPIC VAGINAL HYSTERECTOMY WITH SALPINGECTOMY Bilateral 11/18/2016   Procedure: ATTEMPTED LAPAROSCOPIC ASSISTED VAGINAL HYSTERECTOMY WITH SALPINGECTOMY: LYSIS OF ADHESIONS ABDOMINAL HYSTERECTOMY WTIY SALPIGECTOMY;  Surgeon: Richard Holland, MD;  Location:  SURGERY CENTER;  Service: Gynecology;  Laterality: Bilateral;  . LUMBAR LAMINECTOMY  12/05/2008   L4 -- L5  . MICROTUBOPLASTY  2009   tubal ligation reversal  . TUBAL LIGATION Bilateral 1998    Family History  Adopted: Yes  Problem Relation Age of Onset  . Breast cancer Neg Hx     Social History Social History   Tobacco Use  . Smoking status: Never Smoker  . Smokeless tobacco: Never Used  Substance Use Topics  . Alcohol use: Yes    Comment: ocassionally  . Drug use: No      Allergies  Allergen Reactions  . Ibuprofen Nausea And Vomiting    Stomach pain, cramping  . Penicillins Hives    Has patient had a PCN reaction causing immediate rash, facial/tongue/throat swelling, SOB or lightheadedness with hypotension: no Has patient had a PCN reaction causing severe rash involving mucus membranes or skin necrosis: no Has patient had a PCN reaction that required hospitalization: unknown Has patient had a PCN reaction occurring within the last 10 years: no If all of the above answers are "NO", then may  proceed with Cephalosporin use.   . Adhesive [Tape] Rash    Current Outpatient Medications  Medication Sig Dispense Refill  . ALPRAZolam (XANAX) 1 MG tablet Take 1 mg by mouth at bedtime as needed for anxiety.    . Alum & Mag Hydroxide-Simeth (GI COCKTAIL) SUSP suspension Take 30 mLs by mouth 2 (two) times daily for 7 days. Shake well. 420 mL 0  . DIVIGEL 1 MG/GM GEL APP ONE PACKET D  6  . Eszopiclone 3 MG TABS Take 3 mg by mouth at bedtime as needed for sleep.  1  . fluticasone (FLONASE) 50 MCG/ACT nasal spray Place 1 spray into both nostrils as needed for allergies or rhinitis.    . hydrochlorothiazide (HYDRODIURIL) 12.5 MG tablet Take 12.5 mg by mouth daily.  2  . INTRAROSA 6.5 MG INST INSERT 1 SPPLICATORFUL VAGINALLY AT BEDTIME NIGHTLY  3  . ipratropium-albuterol (DUONEB) 0.5-2.5 (3) MG/3ML SOLN Take 3 mLs by nebulization every 6 (six) hours as needed. 360 mL 4  . levothyroxine (SYNTHROID, LEVOTHROID) 100 MCG tablet Take 100 mcg by mouth daily before breakfast.    . lidocaine (LIDODERM) 5 % PLACE 1 PATCH ONTO THE SKIN Q 12 H. REMOVE AND DISCARD PATCH WITHIN 12 HOURS OR UTD  2  . lidocaine (XYLOCAINE) 5 % ointment APP EXT AA PRN  2  . metoprolol succinate (TOPROL-XL) 100 MG 24 hr tablet Take by mouth.    . omeprazole (PRILOSEC) 40 MG capsule   3  . ondansetron (ZOFRAN ODT) 4 MG disintegrating tablet Allow 1-2 tablets to dissolve in your mouth every 8 hours as needed for nausea/vomiting 30 tablet 0  . potassium chloride SA (KLOR-CON M20) 20 MEQ tablet Take 1 tablet (20 mEq total) by mouth daily. 7 tablet 0  . promethazine (PHENERGAN) 12.5 MG suppository UNW AND I 1 SUP REC Q 4 TO 6 H PRN  0  . traMADol (ULTRAM) 50 MG tablet TK 1 T PO Q 6 H PRN  0   No current facility-administered medications for this visit.      Review of Systems Full ROS  was asked and was negative except for the information on the HPI  Physical Exam Blood pressure (!) 165/98, pulse (!) 58, temperature 97.8 F  (36.6 C), temperature source Temporal, resp. rate 18, height 5' 3" (1.6 m), weight 147 lb (66.7 kg), last menstrual period 09/19/2013. CONSTITUTIONAL: NAD EYES: Pupils are equal, round, and reactive to light, Sclera are non-icteric. EARS, NOSE, MOUTH AND THROAT: The oropharynx is clear. The oral mucosa is pink and moist. Hearing is intact to voice. LYMPH NODES:  Lymph nodes in the neck are normal. RESPIRATORY:  Lungs are clear. There is normal respiratory effort, with equal breath sounds bilaterally, and without pathologic use of accessory muscles. CARDIOVASCULAR: Heart is regular without murmurs, gallops, or rubs. GI: The abdomen is  soft, Mild TTP epigastric area and RUQ, no peritonitis , no Murphy sign. There are no palpable masses. There   is no hepatosplenomegaly. There are normal bowel sounds in all quadrants. GU: Rectal deferred.   MUSCULOSKELETAL: Normal muscle strength and tone. No cyanosis or edema.   SKIN: Turgor is good and there are no pathologic skin lesions or ulcers. NEUROLOGIC: Motor and sensation is grossly normal. Cranial nerves are grossly intact. PSYCH:  Oriented to person, place and time. Affect is normal.  Data Reviewed  I have personally reviewed the patient's imaging, laboratory findings and medical records.    Assessment/Plan 47-year-old female with epigastric and right upper quadrant pain with differential to include IBS versus biliary dyskinesia.  HIDA scan consistent with biliary dyskinesia.  Dr. Vanga has already ordered a CT scan of the abdomen and pelvis that is going to be done within the next 2 to 3 days.  I would like to wait for that CT scan to be performed before proceeding with surgery.  If in fact we find something different we may treated accordingly.  I had a lengthy discussion with the patient regarding her symptoms.  I do think that at some point in time her gallbladder will need to come out.  Discussed with the patient detail about the surgery. The  risks, benefits, complications, treatment options, and expected outcomes were discussed with the patient. The possibilities of bleeding, recurrent infection, finding a normal gallbladder, perforation of viscus organs, damage to surrounding structures, bile leak, abscess formation, needing a drain placed, the need for additional procedures, reaction to medication, pulmonary aspiration,  failure to diagnose a condition, the possible need to convert to an open procedure, and creating a complication requiring transfusion or operation were discussed with the patient. The patient and/or family concurred with the proposed plan, giving informed consent.   Based on recent CT will tailor her therapy.  If the CT scan does not show any other acute intra-abdominal findings my recommendation is to proceed with laparoscopic cholecystectomy for biliary dyskinesia.  They understand that sometimes surgery for biliary dyskinesia is not  curative and some of her symptoms may persist. Copy of this report will be sent to the referring provider   Today's CT scan did not show any other abnormalities therefore we will proceed with cholecystectomy  Diego Pabon, MD FACS General Surgeon 10/04/2018, 3:06 PM   

## 2018-10-04 NOTE — H&P (View-Only) (Signed)
Patient ID: Vanessa Chapman, female   DOB: 06-09-1971, 47 y.o.   MRN: [id number]  HPI Vanessa Chapman is a 47 y.o. female pain in consultation at the request of Dr. Dr. Patrecia Pace.  Reports a history of around 2-1/69-month of epigastric pain that is sharp and severe.  Associated with nausea and abdominal bloating.  She reports the pain is worse after eating about 45 minutes.  There is no specific alleviating factors.  No history of diarrhea no history of weight loss. Have an upper endoscopy by Dr. Allegra Lai last week and I have personally reviewed the images.  There is no abnormal  specific findings.  Also she had a previous work-up by his primary care physician doing a HIDA scan showing an ejection fraction of 35%.  Of note the patient reports significant pain after the HIDA scan.  The only thing that relieves the patient temporarily is some GI cocktail. Has a pending CT scan in a couple of days.  He did have an ultrasound last month showing a normal gallbladder with a normal common bile duct.  No evidence of gallstones. She had a previous history of abdominal hysterectomy with some mild chronic pain on the left side of the scar.  Have back surgery several years ago.  She takes tramadol as needed.  LFTs are completely normal including a lipase and a CBC.  A recent chest x-ray that I personally reviewed showing no active cardiopulmonary disease. I had review all the records extensively from Dr. Patrecia Pace.  He also had some gastric emptying studies showing very mild decrease in transit. She is able to perform more than 4 METS of activity without any shortness of breath or chest pain.  She is adopted so she does not have full access to for family history.  HPI  Past Medical History:  Diagnosis Date  . Allergic asthma, mild intermittent, uncomplicated   . Depression   . GERD (gastroesophageal reflux disease)   . Heart murmur   . Hypertension   . Hypothyroidism   . Hypothyroidism   . Pelvic  pain   . PONV (postoperative nausea and vomiting)   . Wears contact lenses     Past Surgical History:  Procedure Laterality Date  . ABDOMINAL HYSTERECTOMY    . BACK SURGERY    . BALLOON SINUPLASTY  10/2015  . BREAST ENHANCEMENT SURGERY  2008  . BREAST SURGERY    . CESAREAN SECTION  1996  . ENDOMETRIAL ABLATION W/ NOVASURE  01/2015  . ESOPHAGOGASTRODUODENOSCOPY (EGD) WITH PROPOFOL N/A 09/28/2018   Procedure: ESOPHAGOGASTRODUODENOSCOPY (EGD) WITH PROPOFOL;  Surgeon: Toney Reil, MD;  Location[facility];  Service: Gastroenterology;  Laterality: N/A;  . EXCISIONAL HEMORRHOIDECTOMY  09/28/2006  . LAPAROSCOPIC VAGINAL HYSTERECTOMY WITH SALPINGECTOMY Bilateral 11/18/2016   Procedure: ATTEMPTED LAPAROSCOPIC ASSISTED VAGINAL HYSTERECTOMY WITH SALPINGECTOMY: LYSIS OF ADHESIONS ABDOMINAL HYSTERECTOMY Elsie Saas SALPIGECTOMY;  Surgeon: Richarda Overlie, MD;  Location: [facility];  Service: Gynecology;  Laterality: Bilateral;  . LUMBAR LAMINECTOMY  12/05/2008   L4 -- L5  . MICROTUBOPLASTY  2009   tubal ligation reversal  . TUBAL LIGATION Bilateral 1998    Family History  Adopted: Yes  Problem Relation Age of Onset  . Breast cancer Neg Hx     Social History Social History   Tobacco Use  . Smoking status: Never Smoker  . Smokeless tobacco: Never Used  Substance Use Topics  . Alcohol use: Yes    Comment: ocassionally  . Drug use: No  Allergies  Allergen Reactions  . Ibuprofen Nausea And Vomiting    Stomach pain, cramping  . Penicillins Hives    Has patient had a PCN reaction causing immediate rash, facial/tongue/throat swelling, SOB or lightheadedness with hypotension: no Has patient had a PCN reaction causing severe rash involving mucus membranes or skin necrosis: no Has patient had a PCN reaction that required hospitalization: unknown Has patient had a PCN reaction occurring within the last 10 years: no If all of the above answers are "NO", then may  proceed with Cephalosporin use.   . Adhesive [Tape] Rash    Current Outpatient Medications  Medication Sig Dispense Refill  . ALPRAZolam (XANAX) 1 MG tablet Take 1 mg by mouth at bedtime as needed for anxiety.    . Alum & Mag Hydroxide-Simeth (GI COCKTAIL) SUSP suspension Take 30 mLs by mouth 2 (two) times daily for 7 days. Shake well. 420 mL 0  . DIVIGEL 1 MG/GM GEL APP ONE PACKET D  6  . Eszopiclone 3 MG TABS Take 3 mg by mouth at bedtime as needed for sleep.  1  . fluticasone (FLONASE) 50 MCG/ACT nasal spray Place 1 spray into both nostrils as needed for allergies or rhinitis.    . hydrochlorothiazide (HYDRODIURIL) 12.5 MG tablet Take 12.5 mg by mouth daily.  2  . INTRAROSA 6.5 MG INST INSERT 1 SPPLICATORFUL VAGINALLY AT BEDTIME NIGHTLY  3  . ipratropium-albuterol (DUONEB) 0.5-2.5 (3) MG/3ML SOLN Take 3 mLs by nebulization every 6 (six) hours as needed. 360 mL 4  . levothyroxine (SYNTHROID, LEVOTHROID) 100 MCG tablet Take 100 mcg by mouth daily before breakfast.    . lidocaine (LIDODERM) 5 % PLACE 1 PATCH ONTO THE SKIN Q 12 H. REMOVE AND DISCARD PATCH WITHIN 12 HOURS OR UTD  2  . lidocaine (XYLOCAINE) 5 % ointment APP EXT AA PRN  2  . metoprolol succinate (TOPROL-XL) 100 MG 24 hr tablet Take by mouth.    Marland Kitchen omeprazole (PRILOSEC) 40 MG capsule   3  . ondansetron (ZOFRAN ODT) 4 MG disintegrating tablet Allow 1-2 tablets to dissolve in your mouth every 8 hours as needed for nausea/vomiting 30 tablet 0  . potassium chloride SA (KLOR-CON M20) 20 MEQ tablet Take 1 tablet (20 mEq total) by mouth daily. 7 tablet 0  . promethazine (PHENERGAN) 12.5 MG suppository UNW AND I 1 SUP REC Q 4 TO 6 H PRN  0  . traMADol (ULTRAM) 50 MG tablet TK 1 T PO Q 6 H PRN  0   No current facility-administered medications for this visit.      Review of Systems Full ROS  was asked and was negative except for the information on the HPI  Physical Exam Blood pressure (!) 165/98, pulse (!) 58, temperature 97.8 F  (36.6 C), temperature source Temporal, resp. rate 18, height 5\' 3"  (1.6 m), weight 147 lb (66.7 kg), last menstrual period 09/19/2013. CONSTITUTIONAL: NAD EYES: Pupils are equal, round, and reactive to light, Sclera are non-icteric. EARS, NOSE, MOUTH AND THROAT: The oropharynx is clear. The oral mucosa is pink and moist. Hearing is intact to voice. LYMPH NODES:  Lymph nodes in the neck are normal. RESPIRATORY:  Lungs are clear. There is normal respiratory effort, with equal breath sounds bilaterally, and without pathologic use of accessory muscles. CARDIOVASCULAR: Heart is regular without murmurs, gallops, or rubs. GI: The abdomen is  soft, Mild TTP epigastric area and RUQ, no peritonitis , no Murphy sign. There are no palpable masses. There  is no hepatosplenomegaly. There are normal bowel sounds in all quadrants. GU: Rectal deferred.   MUSCULOSKELETAL: Normal muscle strength and tone. No cyanosis or edema.   SKIN: Turgor is good and there are no pathologic skin lesions or ulcers. NEUROLOGIC: Motor and sensation is grossly normal. Cranial nerves are grossly intact. PSYCH:  Oriented to person, place and time. Affect is normal.  Data Reviewed  I have personally reviewed the patient's imaging, laboratory findings and medical records.    Assessment/Plan 47 year old female with epigastric and right upper quadrant pain with differential to include IBS versus biliary dyskinesia.  HIDA scan consistent with biliary dyskinesia.  Dr. Allegra Lai has already ordered a CT scan of the abdomen and pelvis that is going to be done within the next 2 to 3 days.  I would like to wait for that CT scan to be performed before proceeding with surgery.  If in fact we find something different we may treated accordingly.  I had a lengthy discussion with the patient regarding her symptoms.  I do think that at some point in time her gallbladder will need to come out.  Discussed with the patient detail about the surgery. The  risks, benefits, complications, treatment options, and expected outcomes were discussed with the patient. The possibilities of bleeding, recurrent infection, finding a normal gallbladder, perforation of viscus organs, damage to surrounding structures, bile leak, abscess formation, needing a drain placed, the need for additional procedures, reaction to medication, pulmonary aspiration,  failure to diagnose a condition, the possible need to convert to an open procedure, and creating a complication requiring transfusion or operation were discussed with the patient. The patient and/or family concurred with the proposed plan, giving informed consent.   Based on recent CT will tailor her therapy.  If the CT scan does not show any other acute intra-abdominal findings my recommendation is to proceed with laparoscopic cholecystectomy for biliary dyskinesia.  They understand that sometimes surgery for biliary dyskinesia is not  curative and some of her symptoms may persist. Copy of this report will be sent to the referring provider   Today's CT scan did not show any other abnormalities therefore we will proceed with cholecystectomy  Sterling Big, MD FACS General Surgeon 10/04/2018, 3:06 PM

## 2018-10-04 NOTE — Patient Instructions (Addendum)
We will call you on Friday to discuss the CT results.  If you have not heard from [place] by 4 pm on Friday please call our office.

## 2018-10-05 ENCOUNTER — Telehealth: Payer: Self-pay | Admitting: Gastroenterology

## 2018-10-05 NOTE — Telephone Encounter (Signed)
Patient states she had a procedure a couple of weeks ago & Dr Allegra Lai prescribed a Stomach cocktail for her & she would like to know if she can get a refill. Please call her @ [phone]. Is it ok to refill GI Cocktail for pt?  TG

## 2018-10-05 NOTE — Telephone Encounter (Signed)
Patient states she had a procedure a couple of weeks ago & Dr Allegra Lai prescribed a Stomach cocktail for her & she would like to know if she can get a refill. Please call her @ [phone].

## 2018-10-06 NOTE — Telephone Encounter (Signed)
Medication has been called into pharmacy for refill, pt has been notified

## 2018-10-07 ENCOUNTER — Ambulatory Visit
Admission: RE | Admit: 2018-10-07 | Discharge: 2018-10-07 | Disposition: A | Discharge status: Home / Self Care | Payer: BLUE CROSS/BLUE SHIELD | Source: Ambulatory Visit | Attending: Gastroenterology | Admitting: Gastroenterology

## 2018-10-07 ENCOUNTER — Ambulatory Visit (HOSPITAL_COMMUNITY): Payer: BLUE CROSS/BLUE SHIELD

## 2018-10-07 DIAGNOSIS — R109 Unspecified abdominal pain: Secondary | ICD-10-CM | POA: Insufficient documentation

## 2018-10-07 MED ORDER — IOPAMIDOL (ISOVUE-300) INJECTION 61%
100.0000 mL | Freq: Once | INTRAVENOUS | Status: AC | PRN
  Administered 2018-10-07: 100 mL via INTRAVENOUS

## 2018-10-08 ENCOUNTER — Telehealth: Payer: Self-pay

## 2018-10-08 NOTE — Telephone Encounter (Signed)
Patient notified of CT results per Dr.Pabon. Proceed with scheduling Robotic Cholecystectomy.

## 2018-10-08 NOTE — Addendum Note (Signed)
Addended by: Sterling Big F on: 10/08/2018 09:50 AM   Modules accepted: Orders, SmartSet

## 2018-10-11 ENCOUNTER — Telehealth: Payer: Self-pay | Admitting: Surgery

## 2018-10-11 NOTE — Telephone Encounter (Signed)
Patient has called back in regards to discussing a surgical date for a robotic cholecystectomy with Dr Everlene Farrier. Patient, after discussing with her husband, has stated that 10/22/18 would work best for her. I informed her that once the surgery is scheduled and her pre admission testing date has been obtained that she will receive a phone call with all information from our office. Patient was pleased with all information.

## 2018-10-11 NOTE — Telephone Encounter (Signed)
Patient's surgery has been scheduled for 10-22-18 at [facility] with Dr. Everlene Farrier.   She is aware that Pre-admission Testing will be calling her on Friday, 10-15-18 between 1 and 5 pm and to be expecting a phone call.   The patient is aware to call the office should they have further questions.

## 2018-10-15 ENCOUNTER — Other Ambulatory Visit: Payer: Self-pay

## 2018-10-15 ENCOUNTER — Encounter
Admission: RE | Admit: 2018-10-15 | Discharge: 2018-10-15 | Disposition: A | Discharge status: Home / Self Care | Payer: BLUE CROSS/BLUE SHIELD | Source: Ambulatory Visit | Attending: Surgery | Admitting: Surgery

## 2018-10-15 DIAGNOSIS — Z01818 Encounter for other preprocedural examination: Secondary | ICD-10-CM | POA: Diagnosis not present

## 2018-10-15 NOTE — Patient Instructions (Signed)
Your procedure is scheduled on: 10-22-18 FRIDAY Report to Same Day Surgery 2nd floor medical mall [facility] Entrance-take elevator on left to 2nd floor.  Check in with surgery information desk.) To find out your arrival time please call [phone] between 1PM - 3PM on 10-21-18 THURSDAY  Remember: Instructions that are not followed completely may result in serious medical risk, up to and including death, or upon the discretion of your surgeon and anesthesiologist your surgery may need to be rescheduled.    _x___ 1. Do not eat food after midnight the night before your procedure. NO GUM OR CANDY AFTER MIDNIGHT.  You may drink clear liquids up to 2 hours before you are scheduled to arrive at the hospital for your procedure.  Do not drink clear liquids within 2 hours of your scheduled arrival to the hospital.  Clear liquids include  --Water or Apple juice without pulp  --Clear carbohydrate beverage such as ClearFast or Gatorade  --Black Coffee or Clear Tea (No milk, no creamers, do not add anything to  the coffee or Tea   ____Ensure clear carbohydrate drink on the way to the hospital for bariatric patients  ____Ensure clear carbohydrate drink 3 hours before surgery for Dr Rutherford Nail patients if physician instructed.    __x__ 2. No Alcohol for 24 hours before or after surgery.   __x__3. No Smoking or e-cigarettes for 24 prior to surgery.  Do not use any chewable tobacco products for at least 6 hour prior to surgery   ____  4. Bring all medications with you on the day of surgery if instructed.    __x__ 5. Notify your doctor if there is any change in your medical condition     (cold, fever, infections).    x___6. On the morning of surgery brush your teeth with toothpaste and water.  You may rinse your mouth with mouth wash if you wish.  Do not swallow any toothpaste or mouthwash.   Do not wear jewelry, make-up, hairpins, clips or nail polish.  Do not wear lotions, powders, or perfumes.  You may wear deodorant.  Do not shave 48 hours prior to surgery. Men may shave face and neck.  Do not bring valuables to the hospital.    [facility] is not responsible for any belongings or valuables.               Contacts, dentures or bridgework may not be worn into surgery.  Leave your suitcase in the car. After surgery it may be brought to your room.  For patients admitted to the hospital, discharge time is determined by your treatment team.  _  Patients discharged the day of surgery will not be allowed to drive home.  You will need someone to drive you home and stay with you the night of your procedure.    Please read over the following fact sheets that you were given:   [facility] Preparing for Surgery   _x___ TAKE THE FOLLOWING MEDICATION THE MORNING OF SURGERY WITH A SMALL SIP OF WATER These include:  1. SYNTHROID (LEVOTHYROXINE)  2. METOPROLOL  3. PRILOSEC (OMEPRAZOLE)  4. YOU MAY TAKE YOUR XANAX IF NEEDED DAY OF SURGERY  5. YOU MAY TAKE YOUR TRAMADOL/ZOFRAN/PHENERGAN IF NEEDED DAY OF SURGERY  6.  ____Fleets enema or Magnesium Citrate as directed.   _x___ Use CHG Soap or sage wipes as directed on instruction sheet   _X___ Use inhalers on the day of surgery and bring to hospital day of surgery-USE  YOUR ALBUTEROL INHALER DAY OF SURGERY AND BRING INHALER TO HOSPITAL  ____ Stop Metformin and Janumet 2 days prior to surgery.    ____ Take 1/2 of usual insulin dose the night before surgery and none on the morning surgery.   ____ Follow recommendations from Cardiologist, Pulmonologist or PCP regarding stopping Aspirin, Coumadin, Plavix ,Eliquis, Effient, or Pradaxa, and Pletal.  X____Stop Anti-inflammatories such as Advil, Aleve, Ibuprofen, Motrin, Naproxen, Naprosyn, Goodies powders or aspirin products NOW-OK to take Tylenol OR TRAMADOL IF NEEDED   ____ Stop supplements until after surgery.    ____ Bring C-Pap to the hospital.

## 2018-10-19 ENCOUNTER — Telehealth: Payer: Self-pay | Admitting: Gastroenterology

## 2018-10-19 NOTE — Telephone Encounter (Signed)
Patient called and would like to ask Dr Vanessa Chapman to if she will call in the stomach cocktail(lidocaine,diphen,antiacid)just enough until Friday when she is having her gallbladder surgery. This is the only thing that helps her with out doubling over. Please call to [facility] on [address]

## 2018-10-19 NOTE — Telephone Encounter (Signed)
Pt will pick up samples of motegrity in clinic

## 2018-10-20 ENCOUNTER — Telehealth: Payer: Self-pay

## 2018-10-20 NOTE — Telephone Encounter (Signed)
Patient notified of disability paperwork completion and copy was faxed to patient as well as BBT.

## 2018-10-21 MED ORDER — CLINDAMYCIN PHOSPHATE 900 MG/50ML IV SOLN
900.0000 mg | INTRAVENOUS | Status: AC
  Administered 2018-10-22: 900 mg via INTRAVENOUS

## 2018-10-22 ENCOUNTER — Other Ambulatory Visit: Payer: Self-pay

## 2018-10-22 ENCOUNTER — Ambulatory Visit: Payer: BLUE CROSS/BLUE SHIELD | Admitting: Certified Registered"

## 2018-10-22 ENCOUNTER — Encounter
Admission: RE | Disposition: A | Discharge status: Home / Self Care | Payer: Self-pay | Source: Ambulatory Visit | Attending: Surgery

## 2018-10-22 ENCOUNTER — Ambulatory Visit
Admission: RE | Admit: 2018-10-22 | Discharge: 2018-10-22 | Disposition: A | Discharge status: Home / Self Care | Payer: BLUE CROSS/BLUE SHIELD | Source: Ambulatory Visit | Attending: Surgery | Admitting: Surgery

## 2018-10-22 DIAGNOSIS — K219 Gastro-esophageal reflux disease without esophagitis: Secondary | ICD-10-CM | POA: Diagnosis not present

## 2018-10-22 DIAGNOSIS — Z79899 Other long term (current) drug therapy: Secondary | ICD-10-CM | POA: Insufficient documentation

## 2018-10-22 DIAGNOSIS — R102 Pelvic and perineal pain: Secondary | ICD-10-CM | POA: Diagnosis not present

## 2018-10-22 DIAGNOSIS — Z90710 Acquired absence of both cervix and uterus: Secondary | ICD-10-CM | POA: Diagnosis not present

## 2018-10-22 DIAGNOSIS — I10 Essential (primary) hypertension: Secondary | ICD-10-CM | POA: Insufficient documentation

## 2018-10-22 DIAGNOSIS — Z886 Allergy status to analgesic agent status: Secondary | ICD-10-CM | POA: Insufficient documentation

## 2018-10-22 DIAGNOSIS — K828 Other specified diseases of gallbladder: Secondary | ICD-10-CM

## 2018-10-22 DIAGNOSIS — J4520 Mild intermittent asthma, uncomplicated: Secondary | ICD-10-CM | POA: Insufficient documentation

## 2018-10-22 DIAGNOSIS — E039 Hypothyroidism, unspecified: Secondary | ICD-10-CM | POA: Insufficient documentation

## 2018-10-22 DIAGNOSIS — K811 Chronic cholecystitis: Secondary | ICD-10-CM | POA: Diagnosis not present

## 2018-10-22 DIAGNOSIS — F329 Major depressive disorder, single episode, unspecified: Secondary | ICD-10-CM | POA: Insufficient documentation

## 2018-10-22 DIAGNOSIS — R011 Cardiac murmur, unspecified: Secondary | ICD-10-CM | POA: Diagnosis not present

## 2018-10-22 DIAGNOSIS — G8929 Other chronic pain: Secondary | ICD-10-CM | POA: Diagnosis not present

## 2018-10-22 DIAGNOSIS — Z880 Allergy status to penicillin: Secondary | ICD-10-CM | POA: Diagnosis not present

## 2018-10-22 DIAGNOSIS — Z91048 Other nonmedicinal substance allergy status: Secondary | ICD-10-CM | POA: Insufficient documentation

## 2018-10-22 HISTORY — PX: ROBOTIC ASSISTED LAPAROSCOPIC CHOLECYSTECTOMY-MULTI SITE: SHX6603

## 2018-10-22 LAB — POCT I-STAT 4, (NA,K, GLUC, HGB,HCT)
GLUCOSE: 92 mg/dL (ref 70–99)
HCT: 42 % (ref 36.0–46.0)
HEMOGLOBIN: 14.3 g/dL (ref 12.0–15.0)
POTASSIUM: 3.2 mmol/L — AB (ref 3.5–5.1)
Sodium: 142 mmol/L (ref 135–145)

## 2018-10-22 SURGERY — ROBOTIC ASSISTED LAPAROSCOPIC CHOLECYSTECTOMY-MULTI SITE
Anesthesia: General

## 2018-10-22 MED ORDER — PROPOFOL 10 MG/ML IV BOLUS
INTRAVENOUS | Status: AC
  Filled 2018-10-22: qty 20

## 2018-10-22 MED ORDER — EPHEDRINE SULFATE 50 MG/ML IJ SOLN
INTRAMUSCULAR | Status: DC | PRN
  Administered 2018-10-22 (×2): 5 mg via INTRAVENOUS

## 2018-10-22 MED ORDER — OXYCODONE HCL 5 MG/5ML PO SOLN: 5.0000 mg | Freq: Once | ORAL | Status: DC | PRN

## 2018-10-22 MED ORDER — BUPIVACAINE-EPINEPHRINE (PF) 0.25% -1:200000 IJ SOLN
INTRAMUSCULAR | Status: AC
  Filled 2018-10-22: qty 30

## 2018-10-22 MED ORDER — FENTANYL CITRATE (PF) 100 MCG/2ML IJ SOLN
25.0000 ug | INTRAMUSCULAR | Status: DC | PRN
  Administered 2018-10-22: 50 ug via INTRAVENOUS
  Administered 2018-10-22 (×2): 25 ug via INTRAVENOUS

## 2018-10-22 MED ORDER — ONDANSETRON HCL 4 MG/2ML IJ SOLN
INTRAMUSCULAR | Status: DC | PRN
  Administered 2018-10-22: 4 mg via INTRAVENOUS

## 2018-10-22 MED ORDER — FENTANYL CITRATE (PF) 100 MCG/2ML IJ SOLN
INTRAMUSCULAR | Status: DC | PRN
  Administered 2018-10-22: 50 ug via INTRAVENOUS
  Administered 2018-10-22: 100 ug via INTRAVENOUS
  Administered 2018-10-22: 50 ug via INTRAVENOUS

## 2018-10-22 MED ORDER — INDOCYANINE GREEN 25 MG IV SOLR
7.5000 mg | Freq: Once | INTRAVENOUS | Status: AC
  Administered 2018-10-22: 7.5 mg via INTRAVENOUS
  Filled 2018-10-22: qty 8

## 2018-10-22 MED ORDER — SUGAMMADEX SODIUM 200 MG/2ML IV SOLN
INTRAVENOUS | Status: AC
  Filled 2018-10-22: qty 2

## 2018-10-22 MED ORDER — ROCURONIUM BROMIDE 50 MG/5ML IV SOLN
INTRAVENOUS | Status: AC
  Filled 2018-10-22: qty 1

## 2018-10-22 MED ORDER — OXYCODONE HCL 5 MG PO TABS: 5.0000 mg | ORAL_TABLET | Freq: Once | ORAL | Status: DC | PRN

## 2018-10-22 MED ORDER — CLINDAMYCIN PHOSPHATE 900 MG/50ML IV SOLN
INTRAVENOUS | Status: AC
  Filled 2018-10-22: qty 50

## 2018-10-22 MED ORDER — LIDOCAINE HCL (CARDIAC) PF 100 MG/5ML IV SOSY
PREFILLED_SYRINGE | INTRAVENOUS | Status: DC | PRN
  Administered 2018-10-22: 50 mg via INTRAVENOUS

## 2018-10-22 MED ORDER — PHENYLEPHRINE HCL 10 MG/ML IJ SOLN
INTRAMUSCULAR | Status: AC
  Filled 2018-10-22: qty 2

## 2018-10-22 MED ORDER — ROCURONIUM BROMIDE 100 MG/10ML IV SOLN
INTRAVENOUS | Status: DC | PRN
  Administered 2018-10-22: 50 mg via INTRAVENOUS

## 2018-10-22 MED ORDER — EPHEDRINE SULFATE 50 MG/ML IJ SOLN
INTRAMUSCULAR | Status: AC
  Filled 2018-10-22: qty 1

## 2018-10-22 MED ORDER — PROMETHAZINE HCL 25 MG/ML IJ SOLN: 6.2500 mg | INTRAMUSCULAR | Status: DC | PRN

## 2018-10-22 MED ORDER — CHLORHEXIDINE GLUCONATE CLOTH 2 % EX PADS: 6.0000 | MEDICATED_PAD | Freq: Once | CUTANEOUS | Status: DC

## 2018-10-22 MED ORDER — MEPERIDINE HCL 50 MG/ML IJ SOLN: 6.2500 mg | INTRAMUSCULAR | Status: DC | PRN

## 2018-10-22 MED ORDER — LIDOCAINE HCL (PF) 2 % IJ SOLN
INTRAMUSCULAR | Status: AC
  Filled 2018-10-22: qty 10

## 2018-10-22 MED ORDER — ACETAMINOPHEN 10 MG/ML IV SOLN
INTRAVENOUS | Status: AC
  Filled 2018-10-22: qty 100

## 2018-10-22 MED ORDER — GLYCOPYRROLATE 0.2 MG/ML IJ SOLN
INTRAMUSCULAR | Status: AC
  Filled 2018-10-22: qty 1

## 2018-10-22 MED ORDER — GLYCOPYRROLATE 0.2 MG/ML IJ SOLN
INTRAMUSCULAR | Status: DC | PRN
  Administered 2018-10-22: 0.2 mg via INTRAVENOUS

## 2018-10-22 MED ORDER — DEXAMETHASONE SODIUM PHOSPHATE 10 MG/ML IJ SOLN
INTRAMUSCULAR | Status: AC
  Filled 2018-10-22: qty 1

## 2018-10-22 MED ORDER — SUGAMMADEX SODIUM 200 MG/2ML IV SOLN
INTRAVENOUS | Status: DC | PRN
  Administered 2018-10-22: 130 mg via INTRAVENOUS

## 2018-10-22 MED ORDER — FENTANYL CITRATE (PF) 100 MCG/2ML IJ SOLN
INTRAMUSCULAR | Status: AC
  Filled 2018-10-22: qty 2

## 2018-10-22 MED ORDER — MIDAZOLAM HCL 2 MG/2ML IJ SOLN
INTRAMUSCULAR | Status: AC
  Filled 2018-10-22: qty 2

## 2018-10-22 MED ORDER — BUPIVACAINE-EPINEPHRINE 0.25% -1:200000 IJ SOLN
INTRAMUSCULAR | Status: DC | PRN
  Administered 2018-10-22: 30 mL

## 2018-10-22 MED ORDER — ONDANSETRON HCL 4 MG/2ML IJ SOLN
INTRAMUSCULAR | Status: AC
  Filled 2018-10-22: qty 2

## 2018-10-22 MED ORDER — HYDROCODONE-ACETAMINOPHEN 5-325 MG PO TABS: 1.0000 | ORAL_TABLET | Freq: Four times a day (QID) | ORAL | 0 refills | Status: DC | PRN

## 2018-10-22 MED ORDER — SODIUM CHLORIDE FLUSH 0.9 % IV SOLN
INTRAVENOUS | Status: AC
  Filled 2018-10-22: qty 20

## 2018-10-22 MED ORDER — PHENYLEPHRINE HCL 10 MG/ML IJ SOLN
INTRAMUSCULAR | Status: DC | PRN
  Administered 2018-10-22: 100 ug via INTRAVENOUS

## 2018-10-22 MED ORDER — ONDANSETRON HCL 4 MG/2ML IJ SOLN
4.0000 mg | Freq: Once | INTRAMUSCULAR | Status: AC
  Administered 2018-10-22: 4 mg via INTRAVENOUS

## 2018-10-22 MED ORDER — PROPOFOL 10 MG/ML IV BOLUS
INTRAVENOUS | Status: DC | PRN
  Administered 2018-10-22: 130 mg via INTRAVENOUS

## 2018-10-22 MED ORDER — DEXAMETHASONE SODIUM PHOSPHATE 10 MG/ML IJ SOLN
INTRAMUSCULAR | Status: DC | PRN
  Administered 2018-10-22: 8 mg via INTRAVENOUS

## 2018-10-22 MED ORDER — LACTATED RINGERS IV SOLN
INTRAVENOUS | Status: DC
  Administered 2018-10-22: 07:00:00 via INTRAVENOUS

## 2018-10-22 MED ORDER — FENTANYL CITRATE (PF) 100 MCG/2ML IJ SOLN
INTRAMUSCULAR | Status: AC
  Administered 2018-10-22: 50 ug via INTRAVENOUS
  Filled 2018-10-22: qty 2

## 2018-10-22 MED ORDER — ACETAMINOPHEN 10 MG/ML IV SOLN
INTRAVENOUS | Status: DC | PRN
  Administered 2018-10-22: 1000 mg via INTRAVENOUS

## 2018-10-22 MED ORDER — MIDAZOLAM HCL 2 MG/2ML IJ SOLN
INTRAMUSCULAR | Status: DC | PRN
  Administered 2018-10-22: 2 mg via INTRAVENOUS

## 2018-10-22 SURGICAL SUPPLY — 43 items
ADH SKN CLS APL DERMABOND .7 (GAUZE/BANDAGES/DRESSINGS) ×1
BAG SPEC RTRVL LRG 6X4 10 (ENDOMECHANICALS) ×1
CANISTER SUCT 1200ML W/VALVE (MISCELLANEOUS) ×2 IMPLANT
CHLORAPREP W/TINT 26ML (MISCELLANEOUS) ×2 IMPLANT
CLIP VESOLOCK MED LG 6/CT (CLIP) ×4 IMPLANT
CORD BIP STRL DISP 12FT (MISCELLANEOUS) ×2 IMPLANT
COVER TIP SHEARS 8 DVNC (MISCELLANEOUS) IMPLANT
COVER TIP SHEARS 8MM DA VINCI (MISCELLANEOUS)
COVER WAND RF STERILE (DRAPES) ×2 IMPLANT
DECANTER SPIKE VIAL GLASS SM (MISCELLANEOUS) ×2 IMPLANT
DEFOGGER SCOPE WARMER CLEARIFY (MISCELLANEOUS) ×2 IMPLANT
DERMABOND ADVANCED (GAUZE/BANDAGES/DRESSINGS) ×1
DERMABOND ADVANCED .7 DNX12 (GAUZE/BANDAGES/DRESSINGS) ×1 IMPLANT
DRAPE 3 ARM ACCESS DA VINCI (DRAPES) ×1
DRAPE 3 ARM ACCESS DVNC (DRAPES) ×1 IMPLANT
DRAPE SHEET LG 3/4 BI-LAMINATE (DRAPES) ×2 IMPLANT
ELECT REM PT RETURN 9FT ADLT (ELECTROSURGICAL) ×2
ELECTRODE REM PT RTRN 9FT ADLT (ELECTROSURGICAL) ×1 IMPLANT
GLOVE BIO SURGEON STRL SZ7 (GLOVE) ×4 IMPLANT
GOWN STRL REUS W/ TWL LRG LVL3 (GOWN DISPOSABLE) ×4 IMPLANT
GOWN STRL REUS W/TWL LRG LVL3 (GOWN DISPOSABLE) ×8
IRRIGATION STRYKERFLOW (MISCELLANEOUS) IMPLANT
IRRIGATOR STRYKERFLOW (MISCELLANEOUS)
IV NS 1000ML (IV SOLUTION) ×2
IV NS 1000ML BAXH (IV SOLUTION) ×1 IMPLANT
KIT PINK PAD W/HEAD ARE REST (MISCELLANEOUS) ×2
KIT PINK PAD W/HEAD ARM REST (MISCELLANEOUS) ×1 IMPLANT
LABEL OR SOLS (LABEL) ×2 IMPLANT
NEEDLE HYPO 22GX1.5 SAFETY (NEEDLE) ×2 IMPLANT
NS IRRIG 500ML POUR BTL (IV SOLUTION) ×2 IMPLANT
PACK LAP CHOLECYSTECTOMY (MISCELLANEOUS) ×2 IMPLANT
PENCIL ELECTRO HAND CTR (MISCELLANEOUS) ×2 IMPLANT
POUCH SPECIMEN RETRIEVAL 10MM (ENDOMECHANICALS) ×2 IMPLANT
PROGRASP ENDOWRIST DA VINCI (INSTRUMENTS)
PROGRASP ENDOWRIST DVNC (INSTRUMENTS) IMPLANT
SOLUTION ELECTROLUBE (MISCELLANEOUS) ×2 IMPLANT
SPONGE LAP 18X18 RF (DISPOSABLE) ×2 IMPLANT
STRAP SAFETY 5IN WIDE (MISCELLANEOUS) ×2 IMPLANT
SUT MNCRL AB 4-0 PS2 18 (SUTURE) ×2 IMPLANT
SUT VICRYL 0 AB UR-6 (SUTURE) ×4 IMPLANT
TROCAR 130MM GELPORT  DAV (MISCELLANEOUS) ×2 IMPLANT
TROCAR XCEL NON-BLD 5MMX100MML (ENDOMECHANICALS) ×2 IMPLANT
TUBING INSUF HEATED (TUBING) ×2 IMPLANT

## 2018-10-22 NOTE — Transfer of Care (Signed)
Immediate Anesthesia Transfer of Care Note  Patient: Vanessa Chapman  Procedure(s) Performed: ROBOTIC ASSISTED LAPAROSCOPIC CHOLECYSTECTOMY-MULTI SITE (N/A )  Patient Location: PACU  Anesthesia Type:General  Level of Consciousness: drowsy and responds to stimulation  Airway & Oxygen Therapy: Patient Spontanous Breathing and Patient connected to face mask oxygen  Post-op Assessment: Report given to RN and Post -op Vital signs reviewed and stable  Post vital signs: Reviewed and stable  Last Vitals:  Vitals Value Taken Time  BP 153/82 10/22/2018  8:52 AM  Temp    Pulse 84 10/22/2018  8:52 AM  Resp 28 10/22/2018  8:52 AM  SpO2 100 % 10/22/2018  8:52 AM    Last Pain:  Vitals:   10/22/18 0610  TempSrc: Temporal  PainSc: 3          Complications: No apparent anesthesia complications

## 2018-10-22 NOTE — Interval H&P Note (Signed)
History and Physical Interval Note:  10/22/2018 7:17 AM  Vanessa Chapman  has presented today for surgery, with the diagnosis of BILIARY DYSKINESIA  The various methods of treatment have been discussed with the patient and family. After consideration of risks, benefits and other options for treatment, the patient has consented to  Procedure(s): ROBOTIC ASSISTED LAPAROSCOPIC CHOLECYSTECTOMY-MULTI SITE (N/A) as a surgical intervention .  The patient's history has been reviewed, patient examined, no change in status, stable for surgery.  I have reviewed the patient's chart and labs.  Questions were answered to the patient's satisfaction.     Eliya Geiman F Nyjai Graff

## 2018-10-22 NOTE — Anesthesia Postprocedure Evaluation (Signed)
Anesthesia Post Note  Patient: Vanessa Chapman  Procedure(s) Performed: ROBOTIC ASSISTED LAPAROSCOPIC CHOLECYSTECTOMY-MULTI SITE (N/A )  Patient location during evaluation: PACU Anesthesia Type: General Level of consciousness: awake and alert and oriented Pain management: pain level controlled Vital Signs Assessment: post-procedure vital signs reviewed and stable Respiratory status: spontaneous breathing, nonlabored ventilation and respiratory function stable Cardiovascular status: blood pressure returned to baseline and stable Postop Assessment: no signs of nausea or vomiting Anesthetic complications: no     Last Vitals:  Vitals:   10/22/18 0952 10/22/18 1030  BP: (!) 156/82 (!) 156/76  Pulse: (!) 56 60  Resp: 12 14  Temp:  36.4 C  SpO2: 100% 100%    Last Pain:  Vitals:   10/22/18 1030  TempSrc:   PainSc: 0-No pain                 Baptiste Littler

## 2018-10-22 NOTE — OR Nursing (Signed)
Discharge instructions discussed with pt and family. All voice understanding. 

## 2018-10-22 NOTE — Discharge Instructions (Addendum)
Laparoscopic Cholecystectomy, Care After  ° °These instructions give you information on caring for yourself after your procedure. Your doctor may also give you more specific instructions. Call your doctor if you have any problems or questions after your procedure.  °HOME CARE  °Change your bandages (dressings) as told by your doctor.  °Keep the wound dry and clean. Wash the wound gently with soap and water. Pat the wound dry with a clean towel.  °Do not take baths, swim, or use hot tubs for 2 weeks, or as told by your doctor.  °Only take medicine as told by your doctor.  °Eat a normal diet as told by your doctor.  °Do not lift anything heavier than 10 pounds (4.5 kg) until your doctor says it is okay.  °Do not play contact sports for 1 week, or as told by your doctor. °GET HELP IF:  °Your wound is red, puffy (swollen), or painful.  °You have yellowish-white fluid (pus) coming from the wound.  °You have fluid draining from the wound for more than 1 day.  °You have a bad smell coming from the wound.  °Your wound breaks open. °GET HELP RIGHT AWAY IF:  °You have trouble breathing.  °You have chest pain.  °You have a fever >101  °You have pain in the shoulders (shoulder strap areas) that is getting worse.  °You feel dizzy or pass out (faint).  °You have severe belly (abdominal) pain.  °You feel sick to your stomach (nauseous) or throw up (vomit) for more than 1 day. ° ° ° °AMBULATORY SURGERY  °DISCHARGE INSTRUCTIONS ° ° °1) The drugs that you were given will stay in your system until tomorrow so for the next 24 hours you should not: ° °A) Drive an automobile °B) Make any legal decisions °C) Drink any alcoholic beverage ° ° °2) You may resume regular meals tomorrow.  Today it is better to start with liquids and gradually work up to solid foods. ° °You may eat anything you prefer, but it is better to start with liquids, then soup and crackers, and gradually work up to solid foods. ° ° °3) Please notify your doctor  immediately if you have any unusual bleeding, trouble breathing, redness and pain at the surgery site, drainage, fever, or pain not relieved by medication. ° ° ° °4) Additional Instructions: ° ° ° ° ° ° ° °Please contact your physician with any problems or Same Day Surgery at [phone], Monday through Friday 6 am to 4 pm, or [facility] [place] Main number at [phone]. ° ° °

## 2018-10-22 NOTE — Anesthesia Preprocedure Evaluation (Signed)
Anesthesia Evaluation  Patient identified by MRN, date of birth, ID band Patient awake    Reviewed: Allergy & Precautions, NPO status , Patient's Chart, lab work & pertinent test results  History of Anesthesia Complications (+) PONV and history of anesthetic complications  Airway Mallampati: II  TM Distance: >3 FB Neck ROM: Full    Dental no notable dental hx.    Pulmonary asthma , neg sleep apnea,    breath sounds clear to auscultation- rhonchi (-) wheezing      Cardiovascular hypertension, Pt. on medications (-) CAD, (-) Past MI, (-) Cardiac Stents and (-) CABG  Rhythm:Regular Rate:Normal - Systolic murmurs and - Diastolic murmurs    Neuro/Psych PSYCHIATRIC DISORDERS Depression negative neurological ROS     GI/Hepatic Neg liver ROS, GERD  ,  Endo/Other  neg diabetesHypothyroidism   Renal/GU negative Renal ROS     Musculoskeletal negative musculoskeletal ROS (+)   Abdominal (+) - obese,   Peds  Hematology negative hematology ROS (+)   Anesthesia Other Findings Past Medical History: No date: Allergic asthma, mild intermittent, uncomplicated No date: Depression No date: GERD (gastroesophageal reflux disease) No date: Heart murmur     Comment:  asymptomatic No date: Hypertension No date: Hypothyroidism No date: Hypothyroidism No date: Pelvic pain No date: PONV (postoperative nausea and vomiting)     Comment:  years ago No date: Wears contact lenses   Reproductive/Obstetrics                             Anesthesia Physical Anesthesia Plan  ASA: II  Anesthesia Plan: General   Post-op Pain Management:    Induction: Intravenous  PONV Risk Score and Plan: 3 and Ondansetron, Dexamethasone and Midazolam  Airway Management Planned: Oral ETT  Additional Equipment:   Intra-op Plan:   Post-operative Plan: Extubation in OR  Informed Consent: I have reviewed the patients History  and Physical, chart, labs and discussed the procedure including the risks, benefits and alternatives for the proposed anesthesia with the patient or authorized representative who has indicated his/her understanding and acceptance.   Dental advisory given  Plan Discussed with: CRNA and Anesthesiologist  Anesthesia Plan Comments:         Anesthesia Quick Evaluation

## 2018-10-22 NOTE — Anesthesia Post-op Follow-up Note (Signed)
Anesthesia [facility] form completed.        

## 2018-10-22 NOTE — Op Note (Signed)
RoBotic assisted laparoscopic Cholecystectomy  Pre-operative Diagnosis: biliary diskinesia  Post-operative Diagnosis: same  Procedure: Robotic assisted laparoscopic cholecystectomy  Surgeon: Sterling Big, MD FACS  Anesthesia: Gen. with endotracheal tube   Findings: GB  Estimated Blood Loss: 10cc         Drains: none         Specimens: Gallbladder           Complications: none   Procedure Details  The patient was seen again in the Holding Room. The benefits, complications, treatment options, and expected outcomes were discussed with the patient. The risks of bleeding, infection, recurrence of symptoms, failure to resolve symptoms, bile duct damage, bile duct leak, retained common bile duct stone, bowel injury, any of which could require further surgery and/or ERCP, stent, or papillotomy were reviewed with the patient. The likelihood of improving the patient's symptoms with return to their baseline status is good.  The patient and/or family concurred with the proposed plan, giving informed consent.  The patient was taken to Operating Room, identified as Vanessa Chapman and the procedure verified as Robotic Laparoscopic Cholecystectomy.  A Time Out was held and the above information confirmed.  Prior to the induction of general anesthesia, antibiotic prophylaxis was administered. VTE prophylaxis was in place. General endotracheal anesthesia was then administered and tolerated well. After the induction, the abdomen was prepped with Chloraprep and draped in the sterile fashion. The patient was positioned in the supine position.  Cut down technique was used to enter the abdominal cavity and a Hasson trochar was placed after two vicryl stitches were anchored to the fascia. Pneumoperitoneum was then created with CO2 and tolerated well without any adverse changes in the patient's vital signs.  Three 8-mm ports were placed in all under direct vision. All skin incisions  were infiltrated  with a local anesthetic agent before making the incision and placing the trocars.   The patient was positioned  in reverse Trendelenburg. The robot was brought to the surgical field and docked in the standard fashion.  We made sure that all instrumentation was under direct visualization at all times and there was no collision between arms. Scrubbed out and went to the console.   The gallbladder was identified, the fundus grasped and retracted cephalad. Adhesions were lysed bluntly. The infundibulum was grasped and retracted laterally, exposing the peritoneum overlying the triangle of Calot. This was then divided and exposed in a blunt fashion. An extended critical view of the cystic duct and cystic artery was obtained.  The cystic duct was clearly identified and bluntly dissected.   Artery and duct were double clipped and divided. Using ICG cholangiogram we noticed no aberrant or accessory ductal anatomy  The gallbladder was taken from the gallbladder fossa in a retrograde fashion with the electrocautery.  The robot was undocked and all instrumentation was removed. The gallbladder was removed and placed in an Endocatch bag. The liver bed was irrigated and inspected. Hemostasis was achieved with the electrocautery. Copious irrigation was utilized and was repeatedly aspirated until clear.  The gallbladder and Endocatch sac were then removed through a port site.    Inspection of the right upper quadrant was performed. No bleeding, bile duct injury or leak, or bowel injury was noted. Pneumoperitoneum was released.  The periumbilical port site was closed with interrumpted 0 Vicryl sutures. 4-0 subcuticular Monocryl was used to close the skin. Dermabond was  applied.  The patient was then extubated and brought to the recovery room in stable condition.  Sponge, lap, and needle counts were correct at closure and at the conclusion of the case.               Sterling Big, MD, FACS

## 2018-10-22 NOTE — Anesthesia Procedure Notes (Signed)
Procedure Name: Intubation Performed by: Everett Ricciardelli, CRNA Pre-anesthesia Checklist: Patient identified, Patient being monitored, Timeout performed, Emergency Drugs available and Suction available Patient Re-evaluated:Patient Re-evaluated prior to induction Oxygen Delivery Method: Circle system utilized Preoxygenation: Pre-oxygenation with 100% oxygen Induction Type: IV induction Ventilation: Mask ventilation without difficulty Laryngoscope Size: Mac and 3 Grade View: Grade I Tube type: Oral Tube size: 7.0 mm Number of attempts: 1 Airway Equipment and Method: Stylet Placement Confirmation: ETT inserted through vocal cords under direct vision,  positive ETCO2 and breath sounds checked- equal and bilateral Secured at: 21 cm Tube secured with: Tape Dental Injury: Teeth and Oropharynx as per pre-operative assessment        

## 2018-10-25 ENCOUNTER — Encounter: Payer: Self-pay | Admitting: Surgery

## 2018-10-25 LAB — SURGICAL PATHOLOGY

## 2018-10-26 ENCOUNTER — Ambulatory Visit: Payer: BLUE CROSS/BLUE SHIELD | Admitting: Gastroenterology

## 2018-10-29 ENCOUNTER — Ambulatory Visit: Payer: BLUE CROSS/BLUE SHIELD | Admitting: Gastroenterology

## 2018-11-01 ENCOUNTER — Encounter: Payer: Self-pay | Admitting: Surgery

## 2018-11-01 ENCOUNTER — Other Ambulatory Visit: Payer: Self-pay

## 2018-11-01 ENCOUNTER — Ambulatory Visit (INDEPENDENT_AMBULATORY_CARE_PROVIDER_SITE_OTHER): Payer: BLUE CROSS/BLUE SHIELD | Admitting: Surgery

## 2018-11-01 VITALS — BP 154/112 | HR 66 | Temp 97.9°F | Resp 16 | Ht 63.0 in | Wt 144.0 lb

## 2018-11-01 DIAGNOSIS — Z09 Encounter for follow-up examination after completed treatment for conditions other than malignant neoplasm: Secondary | ICD-10-CM

## 2018-11-01 NOTE — Patient Instructions (Addendum)
Please start Miralax today. You may also take Colace every day also.    GENERAL POST-OPERATIVE PATIENT INSTRUCTIONS   WOUND CARE INSTRUCTIONS:  Keep a dry clean dressing on the wound if there is drainage. The initial bandage may be removed after 24 hours.  Once the wound has quit draining you may leave it open to air.  If clothing rubs against the wound or causes irritation and the wound is not draining you may cover it with a dry dressing during the daytime.  Try to keep the wound dry and avoid ointments on the wound unless directed to do so.  If the wound becomes bright red and painful or starts to drain infected material that is not clear, please contact your physician immediately.  If the wound is mildly pink and has a thick firm ridge underneath it, this is normal, and is referred to as a healing ridge.  This will resolve over the next 4-6 weeks.  BATHING: You may shower if you have been informed of this by your surgeon. However, Please do not submerge in a tub, hot tub, or pool until incisions are completely sealed or have been told by your surgeon that you may do so.  DIET:  You may eat any foods that you can tolerate.  It is a good idea to eat a high fiber diet and take in plenty of fluids to prevent constipation.  If you do become constipated you may want to take a mild laxative or take ducolax tablets on a daily basis until your bowel habits are regular.  Constipation can be very uncomfortable, along with straining, after recent surgery.  ACTIVITY:  You are encouraged to cough and deep breath or use your incentive spirometer if you were given one, every 15-30 minutes when awake.  This will help prevent respiratory complications and low grade fevers post-operatively if you had a general anesthetic.  You may want to hug a pillow when coughing and sneezing to add additional support to the surgical area, if you had abdominal or chest surgery, which will decrease pain during these times.  You are  encouraged to walk and engage in light activity for the next two weeks.  You should not lift more than 20 pounds, until 12/03/2018 as it could put you at increased risk for complications.  Twenty pounds is roughly equivalent to a plastic bag of groceries. At that time- Listen to your body when lifting, if you have pain when lifting, stop and then try again in a few days. Soreness after doing exercises or activities of daily living is normal as you get back in to your normal routine.  MEDICATIONS:  Try to take narcotic medications and anti-inflammation medications, such as tylenol, ibuprofen, naprosyn, etc., with food.  This will minimize stomach upset from the medication.  Should you develop nausea and vomiting from the pain medication, or develop a rash, please discontinue the medication and contact your physician.  You should not drive, make important decisions, or operate machinery when taking narcotic pain medication.  SUNBLOCK Use sun block to incision area over the next year if this area will be exposed to sun. This helps decrease scarring and will allow you avoid a permanent darkened area over your incision.  QUESTIONS:  Please feel free to call our office if you have any questions, and we will be glad to assist you. [phone]

## 2018-11-01 NOTE — Progress Notes (Signed)
S/p rob GB Doing well Some soreness Previous sxs much improved Path d/w pt in detail Taking PO, no fevers Some constipation  PE NAD Abd: soft, nt, incisions c/d/i, no infection  A/p Doing well No surgical issues No heavy lifting RTC prn

## 2019-01-18 ENCOUNTER — Ambulatory Visit: Payer: Self-pay | Admitting: Internal Medicine

## 2019-01-25 ENCOUNTER — Ambulatory Visit: Payer: Self-pay | Admitting: Internal Medicine

## 2019-02-01 ENCOUNTER — Encounter: Payer: Self-pay | Admitting: Internal Medicine

## 2019-02-01 ENCOUNTER — Ambulatory Visit: Payer: BC Managed Care – PPO | Admitting: Internal Medicine

## 2019-02-01 VITALS — BP 118/70 | HR 60 | Resp 16 | Ht 63.0 in | Wt 142.0 lb

## 2019-02-01 DIAGNOSIS — R0602 Shortness of breath: Secondary | ICD-10-CM | POA: Diagnosis not present

## 2019-02-01 DIAGNOSIS — J4520 Mild intermittent asthma, uncomplicated: Secondary | ICD-10-CM | POA: Diagnosis not present

## 2019-02-01 DIAGNOSIS — J301 Allergic rhinitis due to pollen: Secondary | ICD-10-CM | POA: Diagnosis not present

## 2019-02-01 DIAGNOSIS — J324 Chronic pansinusitis: Secondary | ICD-10-CM | POA: Diagnosis not present

## 2019-02-01 NOTE — Patient Instructions (Signed)
Asthma, Adult    Asthma is a long-term (chronic) condition in which the airways get tight and narrow. The airways are the breathing passages that lead from the nose and mouth down into the lungs. A person with asthma will have times when symptoms get worse. These are called asthma attacks. They can cause coughing, whistling sounds when you breathe (wheezing), shortness of breath, and chest pain. They can make it hard to breathe. There is no cure for asthma, but medicines and lifestyle changes can help control it.  There are many things that can bring on an asthma attack or make asthma symptoms worse (triggers). Common triggers include:  · Mold.  · Dust.  · Cigarette smoke.  · Cockroaches.  · Things that can cause allergy symptoms (allergens). These include animal skin flakes (dander) and pollen from trees or grass.  · Things that pollute the air. These may include household cleaners, wood smoke, smog, or chemical odors.  · Cold air, weather changes, and wind.  · Crying or laughing hard.  · Stress.  · Certain medicines or drugs.  · Certain foods such as dried fruit, potato chips, and grape juice.  · Infections, such as a cold or the flu.  · Certain medical conditions or diseases.  · Exercise or tiring activities.  Asthma may be treated with medicines and by staying away from the things that cause asthma attacks. Types of medicines may include:  · Controller medicines. These help prevent asthma symptoms. They are usually taken every day.  · Fast-acting reliever or rescue medicines. These quickly relieve asthma symptoms. They are used as needed and provide short-term relief.  · Allergy medicines if your attacks are brought on by allergens.  · Medicines to help control the body's defense (immune) system.  Follow these instructions at home:  Avoiding triggers in your home  · Change your heating and air conditioning filter often.  · Limit your use of fireplaces and wood stoves.  · Get rid of pests (such as roaches and  mice) and their droppings.  · Throw away plants if you see mold on them.  · Clean your floors. Dust regularly. Use cleaning products that do not smell.  · Have someone vacuum when you are not home. Use a vacuum cleaner with a HEPA filter if possible.  · Replace carpet with wood, tile, or vinyl flooring. Carpet can trap animal skin flakes and dust.  · Use allergy-proof pillows, mattress covers, and box spring covers.  · Wash bed sheets and blankets every week in hot water. Dry them in a dryer.  · Keep your bedroom free of any triggers.   · Avoid pets and keep windows closed when things that cause allergy symptoms are in the air.  · Use blankets that are made of polyester or cotton.  · Clean bathrooms and kitchens with bleach. If possible, have someone repaint the walls in these rooms with mold-resistant paint. Keep out of the rooms that are being cleaned and painted.  · Wash your hands often with soap and water. If soap and water are not available, use hand sanitizer.  · Do not allow anyone to smoke in your home.  General instructions  · Take over-the-counter and prescription medicines only as told by your doctor.  ? Talk with your doctor if you have questions about how or when to take your medicines.  ? Make note if you need to use your medicines more often than usual.  · Do not use any products that   contain nicotine or tobacco, such as cigarettes and e-cigarettes. If you need help quitting, ask your doctor.  · Stay away from secondhand smoke.  · Avoid doing things outdoors when allergen counts are high and when air quality is low.  · Wear a ski mask when doing outdoor activities in the winter. The mask should cover your nose and mouth. Exercise indoors on cold days if you can.  · Warm up before you exercise. Take time to cool down after exercise.  · Use a peak flow meter as told by your doctor. A peak flow meter is a tool that measures how well the lungs are working.  · Keep track of the peak flow meter's readings.  Write them down.  · Follow your asthma action plan. This is a written plan for taking care of your asthma and treating your attacks.  · Make sure you get all the shots (vaccines) that your doctor recommends. Ask your doctor about a flu shot and a pneumonia shot.  · Keep all follow-up visits as told by your doctor. This is important.  Contact a doctor if:  · You have wheezing, shortness of breath, or a cough even while taking medicine to prevent attacks.  · The mucus you cough up (sputum) is thicker than usual.  · The mucus you cough up changes from clear or white to yellow, green, gray, or bloody.  · You have problems from the medicine you are taking, such as:  ? A rash.  ? Itching.  ? Swelling.  ? Trouble breathing.  · You need reliever medicines more than 2-3 times a week.  · Your peak flow reading is still at 50-79% of your personal best after following the action plan for 1 hour.  · You have a fever.  Get help right away if:  · You seem to be worse and are not responding to medicine during an asthma attack.  · You are short of breath even at rest.  · You get short of breath when doing very little activity.  · You have trouble eating, drinking, or talking.  · You have chest pain or tightness.  · You have a fast heartbeat.  · Your lips or fingernails start to turn blue.  · You are light-headed or dizzy, or you faint.  · Your peak flow is less than 50% of your personal best.  · You feel too tired to breathe normally.  Summary  · Asthma is a long-term (chronic) condition in which the airways get tight and narrow. An asthma attack can make it hard to breathe.  · Asthma cannot be cured, but medicines and lifestyle changes can help control it.  · Make sure you understand how to avoid triggers and how and when to use your medicines.  This information is not intended to replace advice given to you by your health care provider. Make sure you discuss any questions you have with your health care provider.  Document  Released: 05/19/2008 Document Revised: 01/05/2017 Document Reviewed: 01/05/2017  [employer] Interactive Patient Education © 2019 [employer]

## 2019-02-01 NOTE — Progress Notes (Signed)
[facility] [address] [place], [place] [address]  Pulmonary Sleep Medicine   Office Visit Note  Patient Name: Vanessa Chapman DOB: January 31, 1971 MRN [id number]  Date of Service: 02/01/2019  Complaints/HPI: Pt is here for follow up no asthma, and allergies. She reports she has been doing well.  She reports she had her gall bladder removed in November with no difficulty and most of her pain has resolved. She has not had any issues with her asthma. She takes allergy medications year round, to manage her symptoms.  She is still not interested in repeating her allergy shots at this time.     ROS  General: (-) fever, (-) chills, (-) night sweats, (-) weakness Skin: (-) rashes, (-) itching,. Eyes: (-) visual changes, (-) redness, (-) itching. Nose and Sinuses: (-) nasal stuffiness or itchiness, (-) postnasal drip, (-) nosebleeds, (-) sinus trouble. Mouth and Throat: (-) sore throat, (-) hoarseness. Neck: (-) swollen glands, (-) enlarged thyroid, (-) neck pain. Respiratory: - cough, (-) bloody sputum, - shortness of breath, - wheezing. Cardiovascular: - ankle swelling, (-) chest pain. Lymphatic: (-) lymph node enlargement. Neurologic: (-) numbness, (-) tingling. Psychiatric: (-) anxiety, (-) depression   Current Medication: Outpatient Encounter Medications as of 02/01/2019  Medication Sig  . albuterol (PROVENTIL HFA;VENTOLIN HFA) 108 (90 Base) MCG/ACT inhaler Inhale 1-2 puffs into the lungs every 6 (six) hours as needed for wheezing or shortness of breath.  . ALPRAZolam (XANAX) 1 MG tablet Take 1 mg by mouth at bedtime as needed for anxiety.  Marland Kitchen DIVIGEL 1 MG/GM GEL Apply 1 application topically daily. Apply to thigh  . Eszopiclone 3 MG TABS Take 3 mg by mouth at bedtime.   . fluticasone (FLONASE) 50 MCG/ACT nasal spray Place 1 spray into both nostrils as needed for allergies or rhinitis.  . hydrochlorothiazide (HYDRODIURIL) 12.5 MG tablet Take 12.5 mg by mouth daily.   Andreas Newport 6.5 MG INST Place 1 application vaginally at bedtime.   . Ipratropium-Albuterol (COMBIVENT RESPIMAT) 20-100 MCG/ACT AERS respimat Inhale 1 puff into the lungs every 6 (six) hours as needed for wheezing or shortness of breath.  Marland Kitchen ipratropium-albuterol (DUONEB) 0.5-2.5 (3) MG/3ML SOLN Take 3 mLs by nebulization every 6 (six) hours as needed. (Patient taking differently: Take 3 mLs by nebulization every 6 (six) hours as needed (asthma). )  . levothyroxine (SYNTHROID, LEVOTHROID) 100 MCG tablet Take 100 mcg by mouth daily before breakfast.  . lidocaine (LIDODERM) 5 % Place 1 patch onto the skin daily as needed (pain).   Marland Kitchen lidocaine (XYLOCAINE) 5 % ointment Apply 1 application topically daily as needed (pain).   . metoprolol succinate (TOPROL-XL) 25 MG 24 hr tablet Take 12.5 mg by mouth every morning.   Marland Kitchen omeprazole (PRILOSEC) 40 MG capsule Take 40 mg by mouth 2 (two) times daily.   . traMADol (ULTRAM) 50 MG tablet Take 50 mg by mouth every 6 (six) hours as needed for moderate pain or severe pain.   Marland Kitchen HYDROcodone-acetaminophen (NORCO/VICODIN) 5-325 MG tablet Take 1-2 tablets by mouth every 6 (six) hours as needed for moderate pain. (Patient not taking: Reported on 02/01/2019)  . ondansetron (ZOFRAN ODT) 4 MG disintegrating tablet Allow 1-2 tablets to dissolve in your mouth every 8 hours as needed for nausea/vomiting (Patient not taking: Reported on 02/01/2019)  . promethazine (PHENERGAN) 12.5 MG suppository Place 12.5 mg rectally every 6 (six) hours as needed for nausea or vomiting.    No facility-administered encounter medications on file as of 02/01/2019.  Surgical History: Past Surgical History:  Procedure Laterality Date  . ABDOMINAL HYSTERECTOMY    . BACK SURGERY    . BALLOON SINUPLASTY  10/2015  . BREAST ENHANCEMENT SURGERY  2008  . BREAST SURGERY    . CESAREAN SECTION  1996  . ENDOMETRIAL ABLATION W/ NOVASURE  01/2015  . ESOPHAGOGASTRODUODENOSCOPY (EGD) WITH PROPOFOL N/A  09/28/2018   Procedure: ESOPHAGOGASTRODUODENOSCOPY (EGD) WITH PROPOFOL;  Surgeon: Toney ReilVanga, Rohini Reddy, MD;  [facility];  Service: Gastroenterology;  Laterality: N/A;  . EXCISIONAL HEMORRHOIDECTOMY  09/28/2006  . LAPAROSCOPIC VAGINAL HYSTERECTOMY WITH SALPINGECTOMY Bilateral 11/18/2016   Procedure: ATTEMPTED LAPAROSCOPIC ASSISTED VAGINAL HYSTERECTOMY WITH SALPINGECTOMY: LYSIS OF ADHESIONS ABDOMINAL HYSTERECTOMY Elsie SaasWTIY SALPIGECTOMY;  Surgeon: Richarda Overlieichard Holland, MD;  [facility];  Service: Gynecology;  Laterality: Bilateral;  . LUMBAR LAMINECTOMY  12/05/2008   L4 -- L5  . MICROTUBOPLASTY  2009   tubal ligation reversal  . ROBOTIC ASSISTED LAPAROSCOPIC CHOLECYSTECTOMY-MULTI SITE N/A 10/22/2018   Procedure: ROBOTIC ASSISTED LAPAROSCOPIC CHOLECYSTECTOMY-MULTI SITE;  Surgeon: Leafy RoPabon, Diego F, MD;  Location: [facility];  Service: General;  Laterality: N/A;  . TUBAL LIGATION Bilateral 1998    Medical History: Past Medical History:  Diagnosis Date  . Allergic asthma, mild intermittent, uncomplicated   . Depression   . GERD (gastroesophageal reflux disease)   . Heart murmur    asymptomatic  . Hypertension   . Hypothyroidism   . Hypothyroidism   . Pelvic pain   . PONV (postoperative nausea and vomiting)    years ago  . Wears contact lenses     Family History: Family History  Adopted: Yes  Problem Relation Age of Onset  . Breast cancer Neg Hx     Social History: Social History   Socioeconomic History  . Marital status: Married    Spouse name: Not on file  . Number of children: Not on file  . Years of education: Not on file  . Highest education level: Not on file  Occupational History  . Not on file  Social Needs  . Financial resource strain: Not on file  . Food insecurity:    Worry: Not on file    Inability: Not on file  . Transportation needs:    Medical: Not on file    Non-medical: Not on file  Tobacco Use  . Smoking status: Never Smoker   . Smokeless tobacco: Never Used  Substance and Sexual Activity  . Alcohol use: Not Currently  . Drug use: No  . Sexual activity: Not on file  Lifestyle  . Physical activity:    Days per week: Not on file    Minutes per session: Not on file  . Stress: Not on file  Relationships  . Social connections:    Talks on phone: Not on file    Gets together: Not on file    Attends religious service: Not on file    Active member of club or organization: Not on file    Attends meetings of clubs or organizations: Not on file    Relationship status: Not on file  . Intimate partner violence:    Fear of current or ex partner: Not on file    Emotionally abused: Not on file    Physically abused: Not on file    Forced sexual activity: Not on file  Other Topics Concern  . Not on file  Social History Narrative  . Not on file    Vital Signs: Blood pressure 118/70, pulse 60, resp. rate 16,  height 5\' 3"  (1.6 m), weight 142 lb (64.4 kg), last menstrual period 09/19/2013, SpO2 99 %.  Examination: General Appearance: The patient is well-developed, well-nourished, and in no distress. Skin: Gross inspection of skin unremarkable. Head: normocephalic, no gross deformities. Eyes: no gross deformities noted. ENT: ears appear grossly normal no exudates. Neck: Supple. No thyromegaly. No LAD. Respiratory: clear bilateraly. Cardiovascular: Normal S1 and S2 without murmur or rub. Extremities: No cyanosis. pulses are equal. Neurologic: Alert and oriented. No involuntary movements.  LABS: No results found for this or any previous visit (from the past 2160 hour(s)).  Radiology: No results found.  No results found.  No results found.    Assessment and Plan: Patient Active Problem List   Diagnosis Date Noted  . Biliary dyskinesia   . Mild intermittent asthma without complication 04/06/2018  . Seasonal allergic rhinitis due to pollen 04/06/2018  . Chronic pansinusitis 04/06/2018  . Chronic pelvic  pain in female 01/08/2018  . Chronic pain syndrome 07/23/2017  . Left lower quadrant pain 05/27/2017  . Pelvic pain 11/18/2016    1. Mild intermittent asthma without complication Patient reports her breathing has been doing well overall.  However she has noticed needing to use her Combivent and albuterol a little more frequently than normal.  She is not had a pulmonary function test in over a year which will be ordered at this time. - Pulmonary Function Test; Future  2. Seasonal allergic rhinitis due to pollen Patient's allergies are stable.  With the weather change she states she is expensing some symptoms.  We discussed allergy injections again at this visit and she said that she was not interested in redoing that at this time.  That medications seem to be helping control it for now.  3. Chronic pansinusitis There is a possibility that she is developing another sinus infection at this time she has a history of chronic pansinusitis however she is not had a fever chills or had any new or worsening symptoms at this time.  4. Shortness of breath FVC is 3.1 which is 89% of the predicted value. FEV1 is 1.7 with 63% of the pre-predicted value. FEV1/FVC is 57% which is 71% of the pre-predicted value. - Spirometry with Graph  General Counseling: I have discussed the findings of the evaluation and examination with Clydie Braun.  I have also discussed any further diagnostic evaluation thatmay be needed or ordered today. Valjean verbalizes understanding of the findings of todays visit. We also reviewed her medications today and discussed drug interactions and side effects including but not limited excessive drowsiness and altered mental states. We also discussed that there is always a risk not just to her but also people around her. she has been encouraged to call the office with any questions or concerns that should arise related to todays visit.    Time spent: 25 This patient was seen by Blima Ledger  AGNP-C in Collaboration with Dr. Freda Munro as a part of collaborative care agreement.   I have personally obtained a history, examined the patient, evaluated laboratory and imaging results, formulated the assessment and plan and placed orders.    Yevonne Pax, MD [facility] Pulmonary and Critical Care Sleep medicine

## 2019-02-23 ENCOUNTER — Ambulatory Visit: Payer: Self-pay | Admitting: Internal Medicine

## 2019-03-15 ENCOUNTER — Ambulatory Visit: Payer: Self-pay | Admitting: Internal Medicine

## 2019-08-10 IMAGING — CT CT ABDOMEN W/ CM
2 of 5 series · 17 of 46 positions shown, 19 images · IV contrast (iopamidol)
Comparison: 10/20/2013

CLINICAL DATA: Abdominal pain and nausea and vomiting for 3 months.

EXAM:
CT ABDOMEN WITH CONTRAST
TECHNIQUE: Multidetector CT imaging of the abdomen was performed using the
standard protocol following bolus administration of intravenous
contrast.
CONTRAST:  100mL 8MDPRU-7EE IOPAMIDOL (8MDPRU-7EE) INJECTION 61%

[Series 2: abd pelvis · axial · 0.63mm/px · z∈[-1473,-1238]mm · 14 of 55 slices shown, 16 images (1 of 2)]
[im 4/55  soft-tissue]
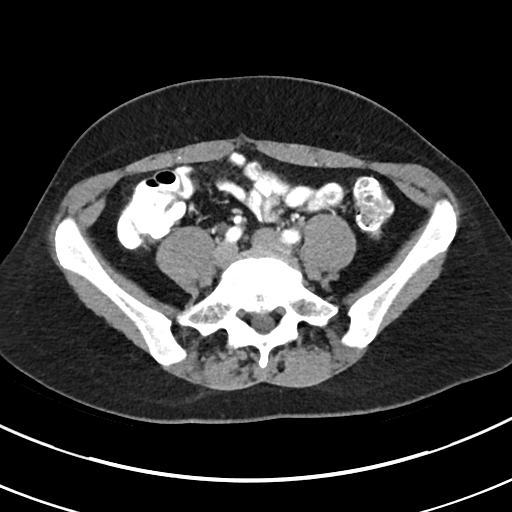
[im 4/55  bone]
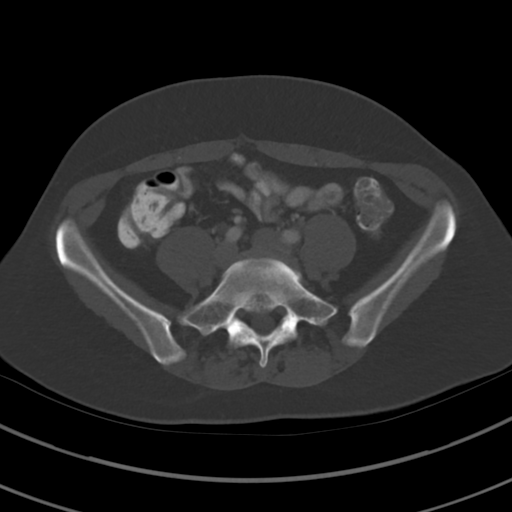
[im 7/55  soft-tissue]
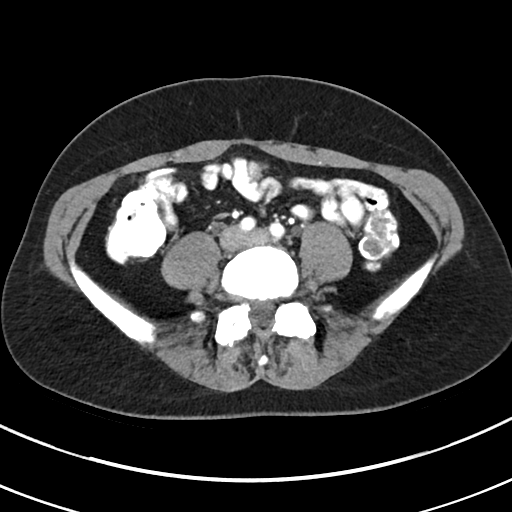
[im 11/55  soft-tissue]
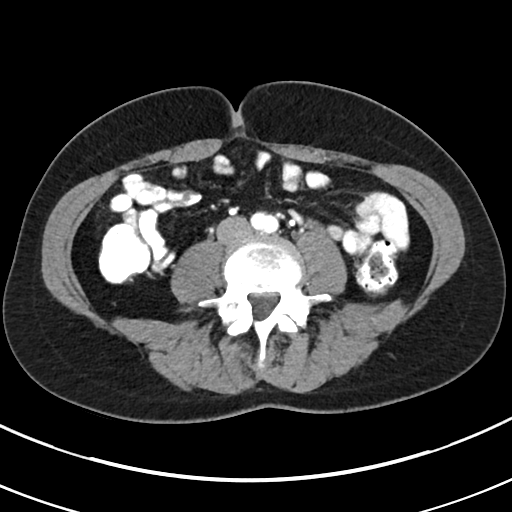
[im 14/55  soft-tissue]
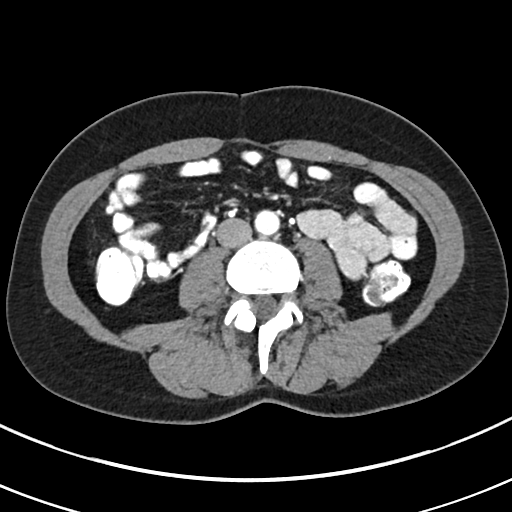
[im 17/55  soft-tissue]
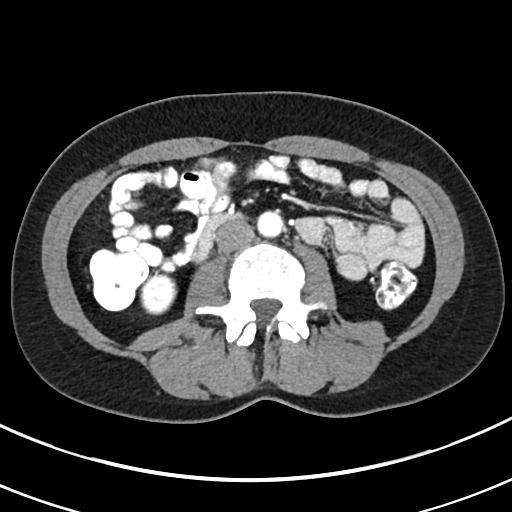
[im 21/55  soft-tissue]
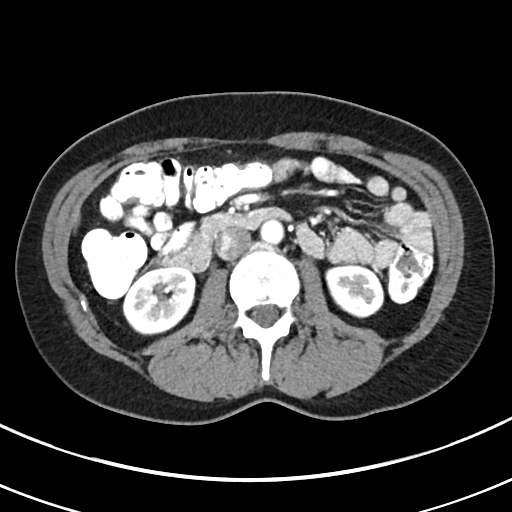
[im 24/55  soft-tissue]
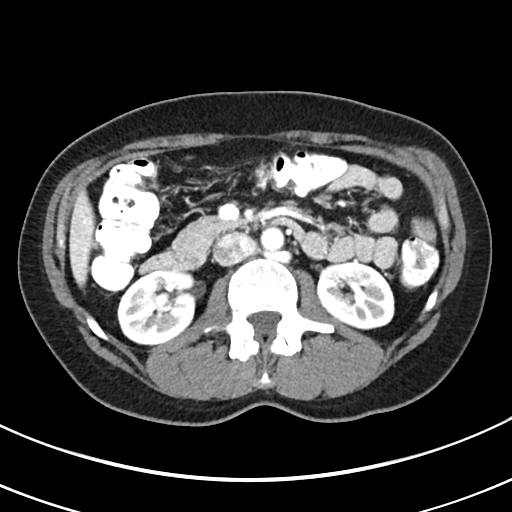
[im 31/55  soft-tissue]
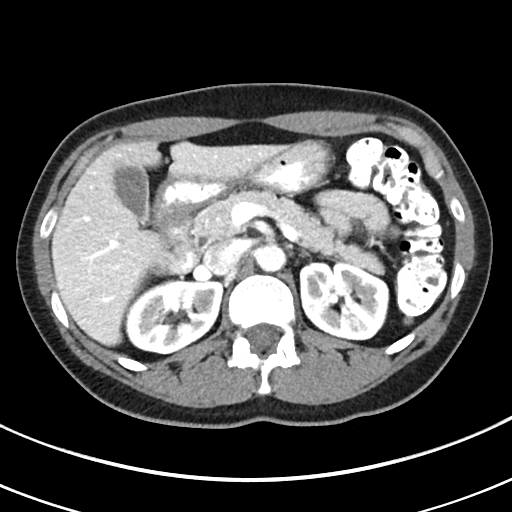
[im 34/55  soft-tissue]
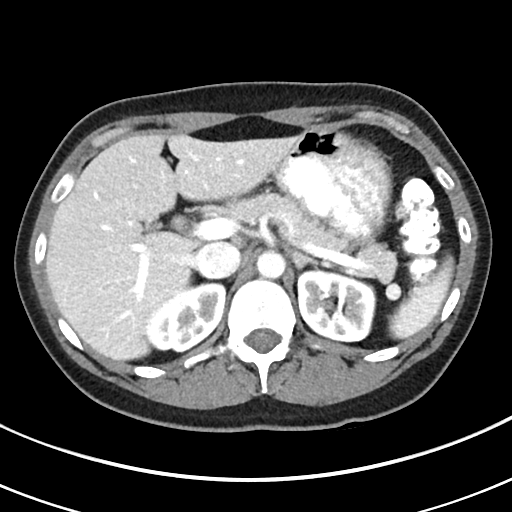
[im 34/55  bone]
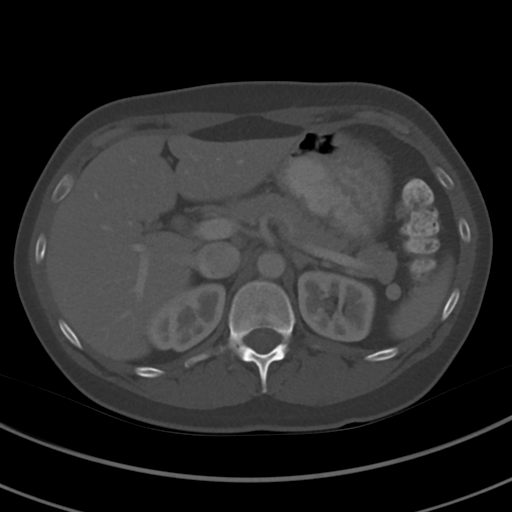
[im 38/55  soft-tissue]
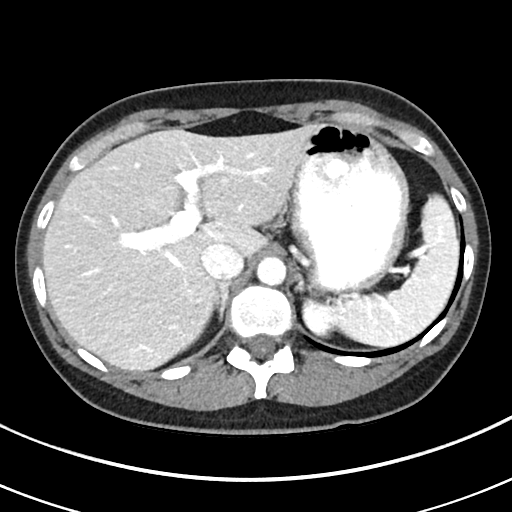
[im 41/55  soft-tissue]
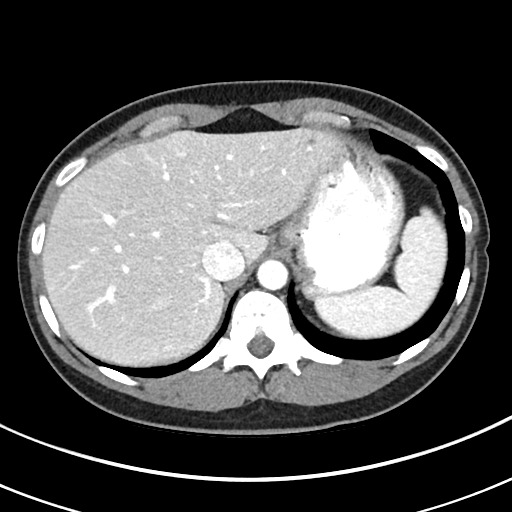
[im 44/55  soft-tissue]
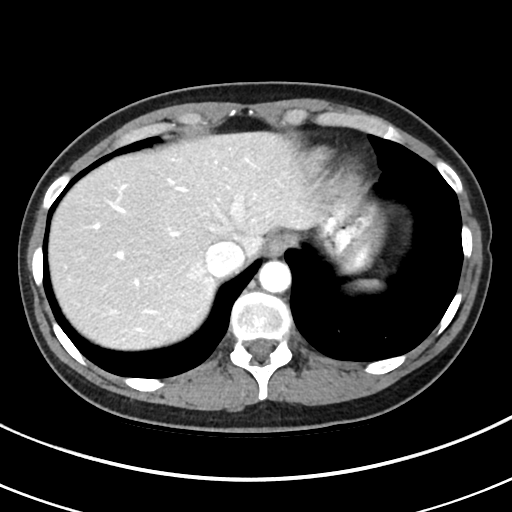
[im 48/55  soft-tissue]
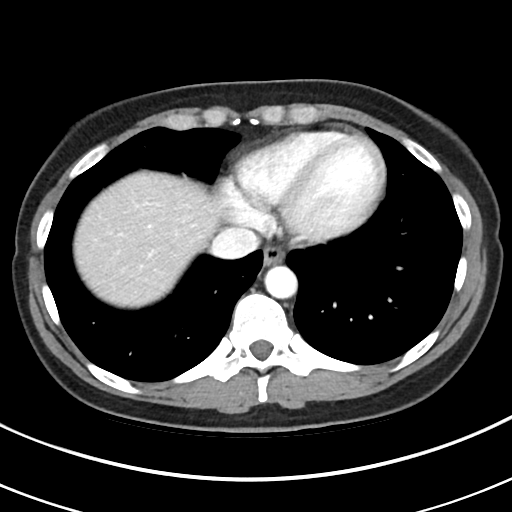
[im 51/55  soft-tissue]
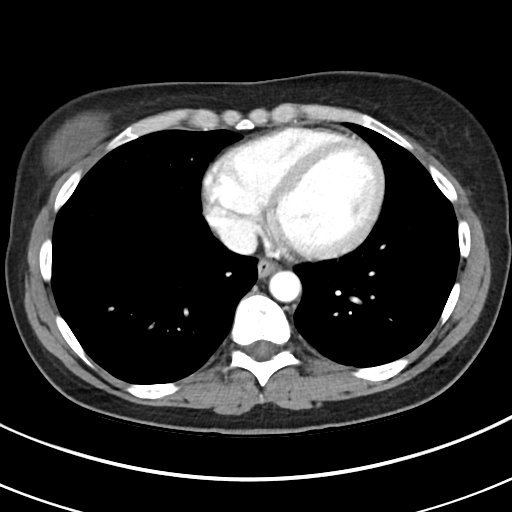

[Series 4: abd pelvis · coronal · 0.54mm/px · 3 of 112 slices shown (2 of 2)]
[im 38/112  soft-tissue]
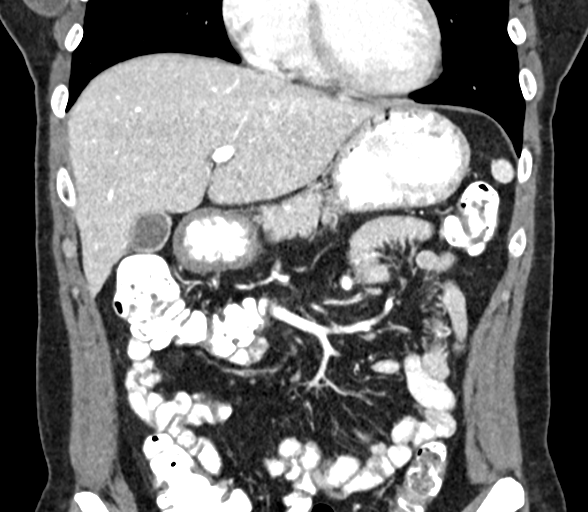
[im 50/112  soft-tissue]
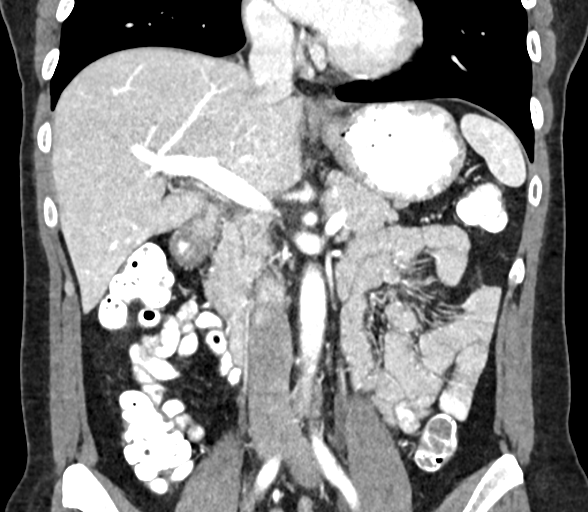
[im 62/112  soft-tissue]
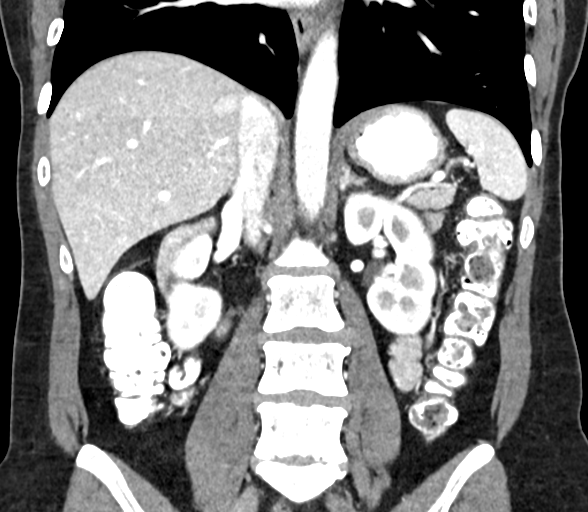

[17 of 46 positions shown; findings below may reference images not displayed]

FINDINGS: Lower chest: No acute findings.

Hepatobiliary: No hepatic masses identified. A few tiny scattered
sub-cm low-attenuation lesions are too small to characterize but
most likely represent tiny cysts. Gallbladder is unremarkable. No
evidence of biliary ductal dilatation.

Pancreas:  No mass or inflammatory changes.

Spleen:  Within normal limits in size and appearance.

Adrenals/Urinary Tract: No masses identified. No evidence of
hydronephrosis.

Stomach/Bowel: Visualized portion unremarkable.

Vascular/Lymphatic: No pathologically enlarged lymph nodes
identified. No abdominal aortic aneurysm.

Other:  None.

Musculoskeletal:  No suspicious bone lesions identified.
IMPRESSION: Unremarkable abdomen CT.  No significant abnormality.

## 2019-11-15 ENCOUNTER — Other Ambulatory Visit: Payer: Self-pay

## 2019-11-15 DIAGNOSIS — J4520 Mild intermittent asthma, uncomplicated: Secondary | ICD-10-CM

## 2019-11-15 MED ORDER — COMBIVENT RESPIMAT 20-100 MCG/ACT IN AERS: 1.0000 | INHALATION_SPRAY | Freq: Four times a day (QID) | RESPIRATORY_TRACT | 1 refills | Status: DC | PRN

## 2019-11-15 MED ORDER — IPRATROPIUM-ALBUTEROL 0.5-2.5 (3) MG/3ML IN SOLN: 3.0000 mL | Freq: Four times a day (QID) | RESPIRATORY_TRACT | 4 refills | Status: AC | PRN

## 2019-11-17 ENCOUNTER — Ambulatory Visit: Payer: BC Managed Care – PPO | Admitting: Internal Medicine

## 2019-11-17 ENCOUNTER — Encounter: Payer: Self-pay | Admitting: Internal Medicine

## 2019-11-17 ENCOUNTER — Other Ambulatory Visit: Payer: Self-pay

## 2019-11-17 VITALS — BP 145/100 | HR 64 | Temp 98.5°F | Ht 63.0 in | Wt 154.0 lb

## 2019-11-17 DIAGNOSIS — U071 COVID-19: Secondary | ICD-10-CM | POA: Diagnosis not present

## 2019-11-17 MED ORDER — PREDNISONE 10 MG (21) PO TBPK: ORAL_TABLET | ORAL | 0 refills | Status: DC

## 2019-11-17 MED ORDER — AZITHROMYCIN 250 MG PO TABS: ORAL_TABLET | ORAL | 0 refills | Status: DC

## 2019-11-17 MED ORDER — IVERMECTIN 3 MG PO TABS: 3.0000 mg | ORAL_TABLET | Freq: Once | ORAL | 0 refills | Status: AC

## 2019-11-17 NOTE — Progress Notes (Signed)
[facility] [address] [place], [place] [address]  Internal MEDICINE  Telephone Visit  Patient Name: Vanessa Chapman  [id number]  [id number]  Date of Service: 11/17/2019  I connected with the patient at 1223 by telephone and verified the patients identity using two identifiers.   I discussed the limitations, risks, security and privacy concerns of performing an evaluation and management service by telephone and the availability of in person appointments. I also discussed with the patient that there may be a patient responsible charge related to the service.  The patient expressed understanding and agrees to proceed.    Chief Complaint  Patient presents with  . covid    covid positive , chest congestion and pain , cough, chills , fatigue , diarrhea , sob when talking     HPI COVID patient apparently tested positive for COVID-19.  She has been having chills aches she has had some shortness of breath.  She was concerned about her overall breathing status.  The patient did not have any significant desaturations noted.  She has been having body aches some cough and congestion.  She is not sure how she got the COVID-19 infection.    Current Medication: Outpatient Encounter Medications as of 11/17/2019  Medication Sig  . albuterol (PROVENTIL HFA;VENTOLIN HFA) 108 (90 Base) MCG/ACT inhaler Inhale 1-2 puffs into the lungs every 6 (six) hours as needed for wheezing or shortness of breath.  . ALPRAZolam (XANAX) 1 MG tablet Take 1 mg by mouth at bedtime as needed for anxiety.  Marland Kitchen. DIVIGEL 1 MG/GM GEL Apply 1 application topically daily. Apply to thigh  . Eszopiclone 3 MG TABS Take 3 mg by mouth at bedtime.   . fluticasone (FLONASE) 50 MCG/ACT nasal spray Place 1 spray into both nostrils as needed for allergies or rhinitis.  . hydrochlorothiazide (HYDRODIURIL) 12.5 MG tablet Take 12.5 mg by mouth daily.  Andreas Newport. INTRAROSA 6.5 MG INST Place 1 application vaginally at bedtime.   .  Ipratropium-Albuterol (COMBIVENT RESPIMAT) 20-100 MCG/ACT AERS respimat Inhale 1 puff into the lungs every 6 (six) hours as needed for wheezing or shortness of breath.  Marland Kitchen. ipratropium-albuterol (DUONEB) 0.5-2.5 (3) MG/3ML SOLN Take 3 mLs by nebulization every 6 (six) hours as needed.  Marland Kitchen. levothyroxine (SYNTHROID, LEVOTHROID) 100 MCG tablet Take 100 mcg by mouth daily before breakfast.  . lidocaine (LIDODERM) 5 % Place 1 patch onto the skin daily as needed (pain).   Marland Kitchen. lidocaine (XYLOCAINE) 5 % ointment Apply 1 application topically daily as needed (pain).   Marland Kitchen. omeprazole (PRILOSEC) 40 MG capsule Take 40 mg by mouth 2 (two) times daily.   . metoprolol succinate (TOPROL-XL) 25 MG 24 hr tablet Take 12.5 mg by mouth every morning.   . promethazine (PHENERGAN) 12.5 MG suppository Place 12.5 mg rectally every 6 (six) hours as needed for nausea or vomiting.   . traMADol (ULTRAM) 50 MG tablet Take 50 mg by mouth every 6 (six) hours as needed for moderate pain or severe pain.   . [DISCONTINUED] HYDROcodone-acetaminophen (NORCO/VICODIN) 5-325 MG tablet Take 1-2 tablets by mouth every 6 (six) hours as needed for moderate pain. (Patient not taking: Reported on 02/01/2019)  . [DISCONTINUED] ondansetron (ZOFRAN ODT) 4 MG disintegrating tablet Allow 1-2 tablets to dissolve in your mouth every 8 hours as needed for nausea/vomiting (Patient not taking: Reported on 02/01/2019)   No facility-administered encounter medications on file as of 11/17/2019.     Surgical History: Past Surgical History:  Procedure Laterality Date  .  ABDOMINAL HYSTERECTOMY    . BACK SURGERY    . BALLOON SINUPLASTY  10/2015  . BREAST ENHANCEMENT SURGERY  2008  . BREAST SURGERY    . CESAREAN SECTION  1996  . ENDOMETRIAL ABLATION W/ NOVASURE  01/2015  . ESOPHAGOGASTRODUODENOSCOPY (EGD) WITH PROPOFOL N/A 09/28/2018   Procedure: ESOPHAGOGASTRODUODENOSCOPY (EGD) WITH PROPOFOL;  Surgeon: Toney Reil, MD;  Location: [facility]COPY;  Service:  Gastroenterology;  Laterality: N/A;  . EXCISIONAL HEMORRHOIDECTOMY  09/28/2006  . LAPAROSCOPIC VAGINAL HYSTERECTOMY WITH SALPINGECTOMY Bilateral 11/18/2016   Procedure: ATTEMPTED LAPAROSCOPIC ASSISTED VAGINAL HYSTERECTOMY WITH SALPINGECTOMY: LYSIS OF ADHESIONS ABDOMINAL HYSTERECTOMY Elsie Saas SALPIGECTOMY;  Surgeon: Richarda Overlie, MD;  Location: [facility];  Service: Gynecology;  Laterality: Bilateral;  . LUMBAR LAMINECTOMY  12/05/2008   L4 -- L5  . MICROTUBOPLASTY  2009   tubal ligation reversal  . ROBOTIC ASSISTED LAPAROSCOPIC CHOLECYSTECTOMY-MULTI SITE N/A 10/22/2018   Procedure: ROBOTIC ASSISTED LAPAROSCOPIC CHOLECYSTECTOMY-MULTI SITE;  Surgeon: Leafy Ro, MD;  Location: [facility] ORS;  Service: General;  Laterality: N/A;  . TUBAL LIGATION Bilateral 1998    Medical History: Past Medical History:  Diagnosis Date  . Allergic asthma, mild intermittent, uncomplicated   . Depression   . GERD (gastroesophageal reflux disease)   . Heart murmur    asymptomatic  . Hypertension   . Hypothyroidism   . Hypothyroidism   . Pelvic pain   . PONV (postoperative nausea and vomiting)    years ago  . Wears contact lenses     Family History: Family History  Adopted: Yes  Problem Relation Age of Onset  . Breast cancer Neg Hx     Social History   Socioeconomic History  . Marital status: Married    Spouse name: Not on file  . Number of children: Not on file  . Years of education: Not on file  . Highest education level: Not on file  Occupational History  . Not on file  Social Needs  . Financial resource strain: Not on file  . Food insecurity    Worry: Not on file    Inability: Not on file  . Transportation needs    Medical: Not on file    Non-medical: Not on file  Tobacco Use  . Smoking status: Never Smoker  . Smokeless tobacco: Never Used  Substance and Sexual Activity  . Alcohol use: Not Currently  . Drug use: No  . Sexual activity: Not on file  Lifestyle  .  Physical activity    Days per week: Not on file    Minutes per session: Not on file  . Stress: Not on file  Relationships  . Social Musician on phone: Not on file    Gets together: Not on file    Attends religious service: Not on file    Active member of club or organization: Not on file    Attends meetings of clubs or organizations: Not on file    Relationship status: Not on file  . Intimate partner violence    Fear of current or ex partner: Not on file    Emotionally abused: Not on file    Physically abused: Not on file    Forced sexual activity: Not on file  Other Topics Concern  . Not on file  Social History Narrative  . Not on file      Review of Systems  Vital Signs: BP (!) 145/100   Pulse 64   Temp 98.5 F (36.9 C)  Ht 5\' 3"  (1.6 m)   Wt 154 lb (69.9 kg)   LMP 09/19/2013   BMI 27.28 kg/m    Observation/Objective:     Assessment/Plan: Acute COVID-19 suggested that she go ahead and get a pulse oximeter from the pharmacy also recommended placing her on azithromycin.  She is not actually sick enough right now for remdesivir.  Patient will take Tylenol for fevers.  Also recommended getting a chest x-ray.  She was told that if there is any worsening of her symptoms that she should immediately go to the emergency department for admission.  Also prednisone as well as ivermectin was prescribed today.  General Counseling: tonni mansour understanding of the findings of today's phone visit and agrees with plan of treatment. I have discussed any further diagnostic evaluation that may be needed or ordered today. We also reviewed her medications today. she has been encouraged to call the office with any questions or concerns that should arise related to todays visit.    No orders of the defined types were placed in this encounter.   Meds ordered this encounter  Medications  . azithromycin (ZITHROMAX) 250 MG tablet    Sig: 1 zpk as directed     Dispense:  6 tablet    Refill:  0  . predniSONE (STERAPRED UNI-PAK 21 TAB) 10 MG (21) TBPK tablet    Sig: 1 dose pk for 6 days    Dispense:  21 tablet    Refill:  0  . ivermectin (STROMECTOL) 3 MG TABS tablet    Sig: Take 1 tablet (3 mg total) by mouth once for 1 dose.    Dispense:  2 tablet    Refill:  0    Time spent:15 [place] MD [facility] Pulmonary Medicine

## 2019-11-21 ENCOUNTER — Other Ambulatory Visit: Payer: Self-pay

## 2019-11-21 ENCOUNTER — Ambulatory Visit
Admission: RE | Admit: 2019-11-21 | Discharge: 2019-11-21 | Disposition: A | Discharge status: Home / Self Care | Payer: BC Managed Care – PPO | Source: Ambulatory Visit | Attending: Internal Medicine | Admitting: Internal Medicine

## 2019-11-21 ENCOUNTER — Other Ambulatory Visit: Payer: Self-pay | Admitting: Internal Medicine

## 2019-11-21 DIAGNOSIS — R0789 Other chest pain: Secondary | ICD-10-CM | POA: Diagnosis not present

## 2019-11-21 DIAGNOSIS — U071 COVID-19: Secondary | ICD-10-CM

## 2019-11-21 MED ORDER — PREDNISONE 10 MG PO TABS: ORAL_TABLET | ORAL | 0 refills | Status: DC

## 2019-11-21 NOTE — Telephone Encounter (Signed)
Pt called that still had congestion,no fever as per dr Humphrey Rolls send prednisone 10 mg and also advised her use inhaler and rest and drink lots of water

## 2019-11-22 ENCOUNTER — Telehealth: Payer: Self-pay

## 2019-11-22 NOTE — Telephone Encounter (Signed)
Pt called to get chest xray results and asked adam and advised pt that the results are normal. Will follow up because patient still feels tightness and will start second round of abx tomorrow.

## 2019-11-23 ENCOUNTER — Telehealth: Payer: Self-pay

## 2019-11-23 NOTE — Telephone Encounter (Signed)
I called pt to follow up on how she is feeling. She started the second round of her prednisone today and she said she feels much better than she has been, but has green mucous coming out of her nose and she is concerned. Spoke with adam and advised patient that she should continue getting the mucous out of her nose and since she has hypertension she can try otc corcidin, her pharmacist can direct her to the right med as well.

## 2019-12-05 ENCOUNTER — Encounter: Payer: Self-pay | Admitting: Internal Medicine

## 2019-12-05 ENCOUNTER — Ambulatory Visit: Payer: BC Managed Care – PPO | Admitting: Internal Medicine

## 2019-12-05 ENCOUNTER — Other Ambulatory Visit: Payer: Self-pay

## 2019-12-05 VITALS — BP 146/91 | Temp 98.2°F

## 2019-12-05 DIAGNOSIS — U071 COVID-19: Secondary | ICD-10-CM | POA: Diagnosis not present

## 2019-12-05 DIAGNOSIS — R0602 Shortness of breath: Secondary | ICD-10-CM

## 2019-12-05 MED ORDER — LEVOFLOXACIN 500 MG PO TABS: 500.0000 mg | ORAL_TABLET | Freq: Every day | ORAL | 0 refills | Status: DC

## 2019-12-05 NOTE — Progress Notes (Signed)
[facility] [address] [place], [place] [address]  Internal MEDICINE  Telephone Visit  Patient Name: Vanessa Chapman  [id number]  [id number]  Date of Service: 12/05/2019  I connected with the patient at 1130 by telephone and verified the patients identity using two identifiers.   I discussed the limitations, risks, security and privacy concerns of performing an evaluation and management service by telephone and the availability of in person appointments. I also discussed with the patient that there may be a patient responsible charge related to the service.  The patient expressed understanding and agrees to proceed.    Chief Complaint  Patient presents with  . Telephone Assessment  . Telephone Screen  . Cough  . Nasal Congestion  . Sore Throat    HPI covid follow-up.  The patient was diagnosed with COVID-19 she has been having some chills and aches was given outpatient management and she states that she is feeling better however now has some congestion and cough.  Concern is whether she may have developed a superinfection with bacterial infection.  She denies having any chest pain there is no fevers or slight congestion.  I did discuss the possibility of taking an antibiotic as a precaution in case this is a bacterial pneumonia.  She was agreeable to that.  In addition I recommended that she get a chest x-ray done.    Current Medication: Outpatient Encounter Medications as of 12/05/2019  Medication Sig  . albuterol (PROVENTIL HFA;VENTOLIN HFA) 108 (90 Base) MCG/ACT inhaler Inhale 1-2 puffs into the lungs every 6 (six) hours as needed for wheezing or shortness of breath.  . ALPRAZolam (XANAX) 1 MG tablet Take 1 mg by mouth at bedtime as needed for anxiety.  Marland Kitchen. DIVIGEL 1 MG/GM GEL Apply 1 application topically daily. Apply to thigh  . Eszopiclone 3 MG TABS Take 3 mg by mouth at bedtime.   . fluticasone (FLONASE) 50 MCG/ACT nasal spray Place 1 spray into both  nostrils as needed for allergies or rhinitis.  . hydrochlorothiazide (HYDRODIURIL) 12.5 MG tablet Take 12.5 mg by mouth daily.  Andreas Newport. INTRAROSA 6.5 MG INST Place 1 application vaginally at bedtime.   . Ipratropium-Albuterol (COMBIVENT RESPIMAT) 20-100 MCG/ACT AERS respimat Inhale 1 puff into the lungs every 6 (six) hours as needed for wheezing or shortness of breath.  Marland Kitchen. ipratropium-albuterol (DUONEB) 0.5-2.5 (3) MG/3ML SOLN Take 3 mLs by nebulization every 6 (six) hours as needed.  Marland Kitchen. levothyroxine (SYNTHROID, LEVOTHROID) 100 MCG tablet Take 100 mcg by mouth daily before breakfast.  . lidocaine (LIDODERM) 5 % Place 1 patch onto the skin daily as needed (pain).   Marland Kitchen. lidocaine (XYLOCAINE) 5 % ointment Apply 1 application topically daily as needed (pain).   . metoprolol succinate (TOPROL-XL) 25 MG 24 hr tablet Take 12.5 mg by mouth every morning.   Marland Kitchen. omeprazole (PRILOSEC) 40 MG capsule Take 40 mg by mouth 2 (two) times daily.   . predniSONE (DELTASONE) 10 MG tablet Take  1 tab po 3 times for 2 days,1 tab po twice a day for 2 days and 1 tab po daily for 2 days  . promethazine (PHENERGAN) 12.5 MG suppository Place 12.5 mg rectally every 6 (six) hours as needed for nausea or vomiting.   . traMADol (ULTRAM) 50 MG tablet Take 50 mg by mouth every 6 (six) hours as needed for moderate pain or severe pain.   Marland Kitchen. azithromycin (ZITHROMAX) 250 MG tablet 1 zpk as directed (Patient not taking: Reported on 12/05/2019)  .  predniSONE (STERAPRED UNI-PAK 21 TAB) 10 MG (21) TBPK tablet 1 dose pk for 6 days (Patient not taking: Reported on 12/05/2019)   No facility-administered encounter medications on file as of 12/05/2019.    Surgical History: Past Surgical History:  Procedure Laterality Date  . ABDOMINAL HYSTERECTOMY    . BACK SURGERY    . BALLOON SINUPLASTY  10/2015  . BREAST ENHANCEMENT SURGERY  2008  . BREAST SURGERY    . CESAREAN SECTION  1996  . ENDOMETRIAL ABLATION W/ NOVASURE  01/2015  .  ESOPHAGOGASTRODUODENOSCOPY (EGD) WITH PROPOFOL N/A 09/28/2018   Procedure: ESOPHAGOGASTRODUODENOSCOPY (EGD) WITH PROPOFOL;  Surgeon: Lin Landsman, MD;  Location: [place];  Service: Gastroenterology;  Laterality: N/A;  . EXCISIONAL HEMORRHOIDECTOMY  09/28/2006  . LAPAROSCOPIC VAGINAL HYSTERECTOMY WITH SALPINGECTOMY Bilateral 11/18/2016   Procedure: ATTEMPTED LAPAROSCOPIC ASSISTED VAGINAL HYSTERECTOMY WITH SALPINGECTOMY: LYSIS OF ADHESIONS ABDOMINAL HYSTERECTOMY Jerrye Bushy SALPIGECTOMY;  Surgeon: Molli Posey, MD;  Location: [place] [facility];  Service: Gynecology;  Laterality: Bilateral;  . LUMBAR LAMINECTOMY  12/05/2008   L4 -- L5  . MICROTUBOPLASTY  2009   tubal ligation reversal  . ROBOTIC ASSISTED LAPAROSCOPIC CHOLECYSTECTOMY-MULTI SITE N/A 10/22/2018   Procedure: ROBOTIC ASSISTED LAPAROSCOPIC CHOLECYSTECTOMY-MULTI SITE;  Surgeon: Jules Husbands, MD;  Location: [facility];  Service: General;  Laterality: N/A;  . TUBAL LIGATION Bilateral 1998    Medical History: Past Medical History:  Diagnosis Date  . Allergic asthma, mild intermittent, uncomplicated   . Depression   . GERD (gastroesophageal reflux disease)   . Heart murmur    asymptomatic  . Hypertension   . Hypothyroidism   . Hypothyroidism   . Pelvic pain   . PONV (postoperative nausea and vomiting)    years ago  . Wears contact lenses     Family History: Family History  Adopted: Yes  Problem Relation Age of Onset  . Breast cancer Neg Hx     Social History   Socioeconomic History  . Marital status: Married    Spouse name: Not on file  . Number of children: Not on file  . Years of education: Not on file  . Highest education level: Not on file  Occupational History  . Not on file  Tobacco Use  . Smoking status: Never Smoker  . Smokeless tobacco: Never Used  Substance and Sexual Activity  . Alcohol use: Not Currently  . Drug use: No  . Sexual activity: Not on file  Other Topics Concern  . Not  on file  Social History Narrative  . Not on file   Social Determinants of Health   Financial Resource Strain:   . Difficulty of Paying Living Expenses: Not on file  Food Insecurity:   . Worried About Charity fundraiser in the Last Year: Not on file  . Ran Out of Food in the Last Year: Not on file  Transportation Needs:   . Lack of Transportation (Medical): Not on file  . Lack of Transportation (Non-Medical): Not on file  Physical Activity:   . Days of Exercise per Week: Not on file  . Minutes of Exercise per Session: Not on file  Stress:   . Feeling of Stress : Not on file  Social Connections:   . Frequency of Communication with Friends and Family: Not on file  . Frequency of Social Gatherings with Friends and Family: Not on file  . Attends Religious Services: Not on file  . Active Member of Clubs or Organizations: Not on file  .  Attends Banker Meetings: Not on file  . Marital Status: Not on file  Intimate Partner Violence:   . Fear of Current or Ex-Partner: Not on file  . Emotionally Abused: Not on file  . Physically Abused: Not on file  . Sexually Abused: Not on file      Review of Systems  Vital Signs: BP (!) 146/91   Temp 98.2 F (36.8 C)   LMP 09/19/2013    Observation/Objective:     Assessment/Plan: #1 COVID-19 virus infection she was diagnosed back around 3 December she initially had some shortness of breath some chills and fevers now she is starting to feel better however still has cough wheezing congestion with some sputum production.  We will place her on Levaquin and follow-up with a chest x-ray. 2.  Shortness of breath this is slowly improving plan is to continue with current management.  I think with the antibiotics because of excessive sputum production should have some improvement also with the shortness of breath  General Counseling: hila bolding understanding of the findings of today's phone visit and agrees with plan of  treatment. I have discussed any further diagnostic evaluation that may be needed or ordered today. We also reviewed her medications today. she has been encouraged to call the office with any questions or concerns that should arise related to todays visit.    Orders Placed This Encounter  Procedures  . DG Chest 2 View    Meds ordered this encounter  Medications  . levofloxacin (LEVAQUIN) 500 MG tablet    Sig: Take 1 tablet (500 mg total) by mouth daily.    Dispense:  7 tablet    Refill:  0    Time spent:15 Minutes    Yevonne Pax MD [facility] Pulmonary Medicine

## 2019-12-08 ENCOUNTER — Encounter: Payer: Self-pay | Admitting: Emergency Medicine

## 2019-12-08 ENCOUNTER — Emergency Department
Admission: EM | Admit: 2019-12-08 | Discharge: 2019-12-08 | Disposition: A | Discharge status: Home / Self Care | Payer: BC Managed Care – PPO | Attending: Student | Admitting: Student

## 2019-12-08 ENCOUNTER — Emergency Department: Payer: BC Managed Care – PPO

## 2019-12-08 ENCOUNTER — Other Ambulatory Visit: Payer: Self-pay

## 2019-12-08 DIAGNOSIS — J45909 Unspecified asthma, uncomplicated: Secondary | ICD-10-CM | POA: Diagnosis not present

## 2019-12-08 DIAGNOSIS — Z79899 Other long term (current) drug therapy: Secondary | ICD-10-CM | POA: Insufficient documentation

## 2019-12-08 DIAGNOSIS — E039 Hypothyroidism, unspecified: Secondary | ICD-10-CM | POA: Insufficient documentation

## 2019-12-08 DIAGNOSIS — R0781 Pleurodynia: Secondary | ICD-10-CM

## 2019-12-08 DIAGNOSIS — I10 Essential (primary) hypertension: Secondary | ICD-10-CM | POA: Diagnosis not present

## 2019-12-08 DIAGNOSIS — R079 Chest pain, unspecified: Secondary | ICD-10-CM

## 2019-12-08 LAB — TROPONIN I (HIGH SENSITIVITY)
Troponin I (High Sensitivity): <2 ng/L (ref <18)
Troponin I (High Sensitivity): 3 ng/L (ref <18)

## 2019-12-08 LAB — CBC
HCT: 42.5 % (ref 36.0–46.0)
Hemoglobin: 14.3 g/dL (ref 12.0–15.0)
MCH: 32.4 pg (ref 26.0–34.0)
MCHC: 33.6 g/dL (ref 30.0–36.0)
MCV: 96.2 fL (ref 80.0–100.0)
Platelets: 222 10*3/uL (ref 150–400)
RBC: 4.42 MIL/uL (ref 3.87–5.11)
RDW: 12.2 % (ref 11.5–15.5)
WBC: 5.4 10*3/uL (ref 4.0–10.5)
nRBC: 0 % (ref 0.0–0.2)

## 2019-12-08 LAB — BASIC METABOLIC PANEL
Anion gap: 9 (ref 5–15)
BUN: 12 mg/dL (ref 6–20)
CO2: 29 mmol/L (ref 22–32)
Calcium: 9.2 mg/dL (ref 8.9–10.3)
Chloride: 103 mmol/L (ref 98–111)
Creatinine, Ser: 0.87 mg/dL (ref 0.44–1.00)
GFR calc Af Amer: >60 mL/min (ref >60)
GFR calc non Af Amer: >60 mL/min (ref >60)
Glucose, Bld: 109 mg/dL — ABNORMAL HIGH (ref 70–99)
Potassium: 3.4 mmol/L — ABNORMAL LOW (ref 3.5–5.1)
Sodium: 141 mmol/L (ref 135–145)

## 2019-12-08 LAB — URINALYSIS, COMPLETE (UACMP) WITH MICROSCOPIC
Bacteria, UA: NONE SEEN
Bilirubin Urine: NEGATIVE
Glucose, UA: NEGATIVE mg/dL
Hgb urine dipstick: NEGATIVE
Ketones, ur: NEGATIVE mg/dL
Leukocytes,Ua: NEGATIVE
Nitrite: NEGATIVE
Protein, ur: NEGATIVE mg/dL
Specific Gravity, Urine: 1.003 — ABNORMAL LOW (ref 1.005–1.030)
pH: 6 (ref 5.0–8.0)

## 2019-12-08 LAB — LIPASE, BLOOD: Lipase: 39 U/L (ref 11–51)

## 2019-12-08 MED ORDER — ASPIRIN 325 MG PO TBEC: 650.0000 mg | DELAYED_RELEASE_TABLET | Freq: Three times a day (TID) | ORAL | 0 refills | Status: AC

## 2019-12-08 MED ORDER — IOHEXOL 350 MG/ML SOLN
75.0000 mL | Freq: Once | INTRAVENOUS | Status: AC | PRN
  Administered 2019-12-08: 75 mL via INTRAVENOUS

## 2019-12-08 MED ORDER — SODIUM CHLORIDE 0.9 % IV BOLUS
1000.0000 mL | Freq: Once | INTRAVENOUS | Status: AC
  Administered 2019-12-08: 1000 mL via INTRAVENOUS

## 2019-12-08 MED ORDER — OMEPRAZOLE 40 MG PO CPDR: 40.0000 mg | DELAYED_RELEASE_CAPSULE | Freq: Every day | ORAL | 0 refills | Status: AC

## 2019-12-08 MED ORDER — FAMOTIDINE IN NACL 20-0.9 MG/50ML-% IV SOLN
20.0000 mg | Freq: Once | INTRAVENOUS | Status: AC
  Administered 2019-12-08: 14:00:00 20 mg via INTRAVENOUS
  Filled 2019-12-08: qty 50

## 2019-12-08 MED ORDER — SODIUM CHLORIDE 0.9% FLUSH: 3.0000 mL | Freq: Once | INTRAVENOUS | Status: DC

## 2019-12-08 MED ORDER — KETOROLAC TROMETHAMINE 30 MG/ML IJ SOLN
30.0000 mg | Freq: Once | INTRAMUSCULAR | Status: AC
  Administered 2019-12-08: 30 mg via INTRAVENOUS
  Filled 2019-12-08: qty 1

## 2019-12-08 NOTE — ED Triage Notes (Signed)
Pt to ED via POV c/o chest and back pain. Pt states that she was COVID positive 11/30. Pt states that she is on her 3rd round of medication. This morning she woke up this morning with a piercing pain in her back that goes through to her chest. Pt states that it is hard to move because of the pain. Pt states that pain is worse when taking a breath. Pt appears uncomfortable but otherwise is in NAD.

## 2019-12-08 NOTE — ED Provider Notes (Signed)
[facility] Emergency Department Provider Note  ____________________________________________   First MD Initiated Contact with Patient 12/08/19 1238     (approximate)  I have reviewed the triage vital signs and the nursing notes.  History  Chief Complaint Chest Pain and Back Pain    HPI Vanessa Chapman is a 48 y.o. female with history of mild intermittent asthma, GERD, HTN, COVID positive on 11/30 who presents emergency department for chest and back pain.  Patient states she has been on multiple courses of medications since her COVID diagnosis, including azithromycin, nebulizer treatments, two rounds of steroids.  Recently started on Levaquin on 12/21 by pulmonology.  She states after initiation of her Levaquin she was feeling better for several days.  However, this morning she woke up and noticed severe, sharp chest and back pain.  Pain seems to originate in her central back, and radiate forward to her central chest.  Sharp in description, severe in intensity.  Worsens with movement and deep inhalation.  No alleviating factors.  No history of the same.  No history of VTE.  No fevers, nausea, vomiting, diarrhea.  No weakness, numbness, tingling.   Past Medical Hx Past Medical History:  Diagnosis Date  . Allergic asthma, mild intermittent, uncomplicated   . Depression   . GERD (gastroesophageal reflux disease)   . Heart murmur    asymptomatic  . Hypertension   . Hypothyroidism   . Hypothyroidism   . Pelvic pain   . PONV (postoperative nausea and vomiting)    years ago  . Wears contact lenses     Problem List Patient Active Problem List   Diagnosis Date Noted  . Biliary dyskinesia   . Mild intermittent asthma without complication 04/06/2018  . Seasonal allergic rhinitis due to pollen 04/06/2018  . Chronic pansinusitis 04/06/2018  . Chronic pelvic pain in female 01/08/2018  . Chronic pain syndrome 07/23/2017  . Left lower quadrant pain  05/27/2017  . Pelvic pain 11/18/2016    Past Surgical Hx Past Surgical History:  Procedure Laterality Date  . ABDOMINAL HYSTERECTOMY    . BACK SURGERY    . BALLOON SINUPLASTY  10/2015  . BREAST ENHANCEMENT SURGERY  2008  . BREAST SURGERY    . CESAREAN SECTION  1996  . ENDOMETRIAL ABLATION W/ NOVASURE  01/2015  . ESOPHAGOGASTRODUODENOSCOPY (EGD) WITH PROPOFOL N/A 09/28/2018   Procedure: ESOPHAGOGASTRODUODENOSCOPY (EGD) WITH PROPOFOL;  Surgeon: Toney ReilVanga, Rohini Reddy, MD;  [facility];  Service: Gastroenterology;  Laterality: N/A;  . EXCISIONAL HEMORRHOIDECTOMY  09/28/2006  . LAPAROSCOPIC VAGINAL HYSTERECTOMY WITH SALPINGECTOMY Bilateral 11/18/2016   Procedure: ATTEMPTED LAPAROSCOPIC ASSISTED VAGINAL HYSTERECTOMY WITH SALPINGECTOMY: LYSIS OF ADHESIONS ABDOMINAL HYSTERECTOMY Elsie SaasWTIY SALPIGECTOMY;  Surgeon: Richarda Overlieichard Holland, MD;  [facility];  Service: Gynecology;  Laterality: Bilateral;  . LUMBAR LAMINECTOMY  12/05/2008   L4 -- L5  . MICROTUBOPLASTY  2009   tubal ligation reversal  . ROBOTIC ASSISTED LAPAROSCOPIC CHOLECYSTECTOMY-MULTI SITE N/A 10/22/2018   Procedure: ROBOTIC ASSISTED LAPAROSCOPIC CHOLECYSTECTOMY-MULTI SITE;  Surgeon: Leafy RoPabon, Diego F, MD;  Location: [facility];  Service: General;  Laterality: N/A;  . TUBAL LIGATION Bilateral 1998    Medications Prior to Admission medications   Medication Sig Start Date End Date Taking? Authorizing Provider  albuterol (PROVENTIL HFA;VENTOLIN HFA) 108 (90 Base) MCG/ACT inhaler Inhale 1-2 puffs into the lungs every 6 (six) hours as needed for wheezing or shortness of breath.    [provider]  ALPRAZolam Prudy Feeler(XANAX) 1 MG tablet Take 1 mg  by mouth at bedtime as needed for anxiety.    [provider]  azithromycin (ZITHROMAX) 250 MG tablet 1 zpk as directed Patient not taking: Reported on 12/05/2019 11/17/19   Allyne Gee, MD  DIVIGEL 1 MG/GM GEL Apply 1 application topically daily. Apply to thigh  06/30/18   [provider]  Eszopiclone 3 MG TABS Take 3 mg by mouth at bedtime.  09/17/16   [provider]  fluticasone (FLONASE) 50 MCG/ACT nasal spray Place 1 spray into both nostrils as needed for allergies or rhinitis.    [provider]  hydrochlorothiazide (HYDRODIURIL) 12.5 MG tablet Take 12.5 mg by mouth daily. 08/06/18   [provider]  INTRAROSA 6.5 MG INST Place 1 application vaginally at bedtime.  06/21/18   [provider]  Ipratropium-Albuterol (COMBIVENT RESPIMAT) 20-100 MCG/ACT AERS respimat Inhale 1 puff into the lungs every 6 (six) hours as needed for wheezing or shortness of breath. 11/15/19   Allyne Gee, MD  ipratropium-albuterol (DUONEB) 0.5-2.5 (3) MG/3ML SOLN Take 3 mLs by nebulization every 6 (six) hours as needed. 11/15/19   Allyne Gee, MD  levofloxacin (LEVAQUIN) 500 MG tablet Take 1 tablet (500 mg total) by mouth daily. 12/05/19   Allyne Gee, MD  levothyroxine (SYNTHROID, LEVOTHROID) 100 MCG tablet Take 100 mcg by mouth daily before breakfast.    [provider]  lidocaine (LIDODERM) 5 % Place 1 patch onto the skin daily as needed (pain).  09/03/18   [provider]  lidocaine (XYLOCAINE) 5 % ointment Apply 1 application topically daily as needed (pain).  09/23/18   [provider]  metoprolol succinate (TOPROL-XL) 25 MG 24 hr tablet Take 12.5 mg by mouth every morning.     [provider]  omeprazole (PRILOSEC) 40 MG capsule Take 40 mg by mouth 2 (two) times daily.  09/10/18   [provider]  predniSONE (DELTASONE) 10 MG tablet Take  1 tab po 3 times for 2 days,1 tab po twice a day for 2 days and 1 tab po daily for 2 days 11/21/19   Lavera Guise, MD  predniSONE (STERAPRED UNI-PAK 21 TAB) 10 MG (21) TBPK tablet 1 dose pk for 6 days Patient not taking: Reported on 12/05/2019 11/17/19   Allyne Gee, MD  promethazine (PHENERGAN) 12.5 MG suppository Place 12.5 mg rectally every 6  (six) hours as needed for nausea or vomiting.  09/23/18   [provider]  traMADol (ULTRAM) 50 MG tablet Take 50 mg by mouth every 6 (six) hours as needed for moderate pain or severe pain.  07/01/18   [provider]    Allergies Ibuprofen, Penicillins, and Adhesive [tape]  Family Hx Family History  Adopted: Yes  Problem Relation Age of Onset  . Breast cancer Neg Hx     Social Hx Social History   Tobacco Use  . Smoking status: Never Smoker  . Smokeless tobacco: Never Used  Substance Use Topics  . Alcohol use: Not Currently  . Drug use: No     Review of Systems  Constitutional: Negative for fever, chills. Eyes: Negative for visual changes. ENT: Negative for sore throat. Cardiovascular: + for chest pain. Respiratory: Negative for shortness of breath. Gastrointestinal: Negative for nausea, vomiting.  Genitourinary: Negative for dysuria. Musculoskeletal: Negative for leg swelling. + back pain Skin: Negative for rash. Neurological: Negative for for headaches.   Physical Exam  Vital Signs: ED Triage Vitals  Enc Vitals Group  BP 12/08/19 1051 (!) 169/106     Pulse Rate 12/08/19 1051 82     Resp 12/08/19 1051 16     Temp 12/08/19 1052 98.6 F (37 C)     Temp Source 12/08/19 1052 Oral     SpO2 12/08/19 1051 97 %     Weight 12/08/19 1052 155 lb (70.3 kg)     Height 12/08/19 1052 5\' 3"  (1.6 m)     Head Circumference --      Peak Flow --      Pain Score 12/08/19 1051 8     Pain Loc --      Pain Edu? --      Excl. in GC? --     Constitutional: Alert and oriented.  Head: Normocephalic. Atraumatic. Eyes: Conjunctivae clear. Sclera anicteric. Nose: No congestion. No rhinorrhea. Mouth/Throat: Wearing mask.  Neck: No stridor.   Cardiovascular: Normal rate, regular rhythm.  Very slight pericardial rub appreciated.  Extremities well perfused. Respiratory: Normal respiratory effort.  Lungs CTAB. Gastrointestinal: Soft. Non-tender. Non-distended.    Musculoskeletal: No lower extremity edema. No deformities. Neurologic:  Normal speech and language. No gross focal neurologic deficits are appreciated.  Skin: Skin is warm, dry and intact. No rash noted. Psychiatric: Mood and affect are appropriate for situation.  EKG  Personally reviewed.   Rate: 76 Rhythm: sinus Axis: normal Intervals: WNL No acute ischemic changes No obvious PR depressions or ST elevations No STEMI    Radiology  CXR:  IMPRESSION:  No edema or consolidation. No evident adenopathy.    Procedures  Procedure(s) performed (including critical care):  Procedures   Initial Impression / Assessment and Plan / ED Course  48 y.o. female known COVID positive who presents to the ED for chest and back pain.  As above.  Ddx: pleurisy, pericarditis, PE, pancreatitis, ACS, post COVID lingering symptoms/sequelae. Doubt aortic pathology given no significant hypertension, no associated neurological symptoms, equal and symmetric distal pulses.   Will obtain labs, EKG, imaging, symptom control and reassess.  EKG without acute ischemic changes.  Initial troponin negative.  Therefore less likely to be ACS or myocarditis, also less likely myocarditis given she is HDS and overall well appearing.  Repeat troponin negative.  CT scan negative.  Patient reports feeling improved.  As such, given negative work-up, will plan for discharge.  We will plan for course of tapered aspirin in case of pleurisy/pericarditis component, along with PPI given hx of reflux/GERD. Patient agreeable with plan. Discussed return precautions.   Final Clinical Impression(s) / ED Diagnosis  Final diagnoses:  Chest pain in adult  Pleuritic chest pain       Note:  This document was prepared using Dragon voice recognition software and may include unintentional dictation errors.   54., MD 12/08/19 1500

## 2019-12-08 NOTE — Discharge Instructions (Addendum)
Thank you for letting us take care of you in the ER today.   New medications we prescribed: - Aspirin - start by taking the prescription as written, 650 mg, three times a day with meals for 10 days.  - After this, obtain over the counter aspirin (usually sold as 81 mg, also called "baby aspirin"). Take 4 baby aspirin (81 mg x 4 = 324 mg) twice a day for the next 10 days - Then take 1 baby aspirin (81 mg) for ten days, after this, your taper should be complete  - We have also prescribed you an acid protecting medication (omeprazole) to help protect your stomach while on the aspirin. Start by taking 40 mg daily in the morning. If you find the aspirin is bothering you, you can obtain the omeprazole over the counter and increase to 40 mg twice a day.  Please return to the emergency department for any new or worsening symptoms.

## 2019-12-08 NOTE — ED Notes (Signed)
Dr. Funke at bedside. 

## 2019-12-08 NOTE — ED Notes (Signed)
Pt given blanket.

## 2019-12-20 ENCOUNTER — Telehealth: Payer: Self-pay

## 2019-12-20 NOTE — Telephone Encounter (Signed)
Done by mistake. klh 

## 2019-12-20 NOTE — Telephone Encounter (Signed)
Confirmed appointment with patient. klh °

## 2019-12-22 ENCOUNTER — Ambulatory Visit: Payer: BC Managed Care – PPO | Admitting: Internal Medicine

## 2019-12-22 ENCOUNTER — Encounter: Payer: Self-pay | Admitting: Internal Medicine

## 2019-12-22 VITALS — BP 131/90 | HR 71 | Temp 98.3°F

## 2019-12-22 DIAGNOSIS — J301 Allergic rhinitis due to pollen: Secondary | ICD-10-CM | POA: Diagnosis not present

## 2019-12-22 DIAGNOSIS — U071 COVID-19: Secondary | ICD-10-CM | POA: Diagnosis not present

## 2019-12-22 DIAGNOSIS — J4520 Mild intermittent asthma, uncomplicated: Secondary | ICD-10-CM

## 2019-12-22 DIAGNOSIS — R05 Cough: Secondary | ICD-10-CM

## 2019-12-22 DIAGNOSIS — R059 Cough, unspecified: Secondary | ICD-10-CM

## 2019-12-22 MED ORDER — BENZONATATE 200 MG PO CAPS: 200.0000 mg | ORAL_CAPSULE | Freq: Two times a day (BID) | ORAL | 0 refills | Status: DC | PRN

## 2019-12-22 MED ORDER — HYDROCOD POLST-CPM POLST ER 10-8 MG/5ML PO SUER: 5.0000 mL | Freq: Two times a day (BID) | ORAL | 0 refills | Status: DC | PRN

## 2019-12-22 NOTE — Progress Notes (Signed)
[facility] [address] [place], [place] [address]  Internal MEDICINE  Telephone Visit  Patient Name: Vanessa Chapman  [id number]  [id number]  Date of Service: 12/22/2019  I connected with the patient at 1143  by telephone and verified the patients identity using two identifiers.   I discussed the limitations, risks, security and privacy concerns of performing an evaluation and management service by telephone and the availability of in person appointments. I also discussed with the patient that there may be a patient responsible charge related to the service.  The patient expressed understanding and agrees to proceed.    Chief Complaint  Patient presents with  . Telephone Assessment  . Telephone Screen  . Asthma  . Shortness of Breath    HPI  PT seen via telephone for pulmonary follow up.  She has recently had covid infection.  She continues to have fatigue, cough, and some pericarditis. She has seen cardiology recently, and has been placed on colchicine. She has had multiple round of medications, including prednisone, which makes her feel a little better.  She reports some ongoing SOB, but she feels it has improved some.  Denies fever or overt sob. She is using her combivent and nebulizer as needed.     Current Medication: Outpatient Encounter Medications as of 12/22/2019  Medication Sig  . albuterol (PROVENTIL HFA;VENTOLIN HFA) 108 (90 Base) MCG/ACT inhaler Inhale 1-2 puffs into the lungs every 6 (six) hours as needed for wheezing or shortness of breath.  . ALPRAZolam (XANAX) 1 MG tablet Take 1 mg by mouth at bedtime as needed for anxiety.  Marland Kitchen DIVIGEL 1 MG/GM GEL Apply 1 application topically daily. Apply to thigh  . Eszopiclone 3 MG TABS Take 3 mg by mouth at bedtime.   . fluticasone (FLONASE) 50 MCG/ACT nasal spray Place 1 spray into both nostrils as needed for allergies or rhinitis.  . hydrochlorothiazide (HYDRODIURIL) 12.5 MG tablet Take 12.5 mg by mouth  daily.  Andreas Newport 6.5 MG INST Place 1 application vaginally at bedtime.   . Ipratropium-Albuterol (COMBIVENT RESPIMAT) 20-100 MCG/ACT AERS respimat Inhale 1 puff into the lungs every 6 (six) hours as needed for wheezing or shortness of breath.  Marland Kitchen ipratropium-albuterol (DUONEB) 0.5-2.5 (3) MG/3ML SOLN Take 3 mLs by nebulization every 6 (six) hours as needed.  Marland Kitchen levothyroxine (SYNTHROID, LEVOTHROID) 100 MCG tablet Take 100 mcg by mouth daily before breakfast.  . lidocaine (LIDODERM) 5 % Place 1 patch onto the skin daily as needed (pain).   Marland Kitchen lidocaine (XYLOCAINE) 5 % ointment Apply 1 application topically daily as needed (pain).   . metoprolol succinate (TOPROL-XL) 25 MG 24 hr tablet Take 12.5 mg by mouth every morning.   Marland Kitchen omeprazole (PRILOSEC) 40 MG capsule Take 1 capsule (40 mg total) by mouth daily.  . predniSONE (DELTASONE) 10 MG tablet Take  1 tab po 3 times for 2 days,1 tab po twice a day for 2 days and 1 tab po daily for 2 days  . promethazine (PHENERGAN) 12.5 MG suppository Place 12.5 mg rectally every 6 (six) hours as needed for nausea or vomiting.   . traMADol (ULTRAM) 50 MG tablet Take 50 mg by mouth every 6 (six) hours as needed for moderate pain or severe pain.   . benzonatate (TESSALON) 200 MG capsule Take 1 capsule (200 mg total) by mouth 2 (two) times daily as needed for cough.  . chlorpheniramine-HYDROcodone (TUSSIONEX PENNKINETIC ER) 10-8 MG/5ML SUER Take 5 mLs by mouth every 12 (twelve)  hours as needed for cough.  . [DISCONTINUED] azithromycin (ZITHROMAX) 250 MG tablet 1 zpk as directed (Patient not taking: Reported on 12/22/2019)  . [DISCONTINUED] levofloxacin (LEVAQUIN) 500 MG tablet Take 1 tablet (500 mg total) by mouth daily. (Patient not taking: Reported on 12/22/2019)  . [DISCONTINUED] omeprazole (PRILOSEC) 40 MG capsule Take 40 mg by mouth 2 (two) times daily.   . [DISCONTINUED] predniSONE (STERAPRED UNI-PAK 21 TAB) 10 MG (21) TBPK tablet 1 dose pk for 6 days (Patient not  taking: Reported on 12/22/2019)   No facility-administered encounter medications on file as of 12/22/2019.    Surgical History: Past Surgical History:  Procedure Laterality Date  . ABDOMINAL HYSTERECTOMY    . BACK SURGERY    . BALLOON SINUPLASTY  10/2015  . BREAST ENHANCEMENT SURGERY  2008  . BREAST SURGERY    . CESAREAN SECTION  1996  . ENDOMETRIAL ABLATION W/ NOVASURE  01/2015  . ESOPHAGOGASTRODUODENOSCOPY (EGD) WITH PROPOFOL N/A 09/28/2018   Procedure: ESOPHAGOGASTRODUODENOSCOPY (EGD) WITH PROPOFOL;  Surgeon: Toney Reil, MD;  Location: [facility] ENDOSCOPY;  Service: Gastroenterology;  Laterality: N/A;  . EXCISIONAL HEMORRHOIDECTOMY  09/28/2006  . LAPAROSCOPIC VAGINAL HYSTERECTOMY WITH SALPINGECTOMY Bilateral 11/18/2016   Procedure: ATTEMPTED LAPAROSCOPIC ASSISTED VAGINAL HYSTERECTOMY WITH SALPINGECTOMY: LYSIS OF ADHESIONS ABDOMINAL HYSTERECTOMY Elsie Saas SALPIGECTOMY;  Surgeon: Richarda Overlie, MD;  Location: [facility];  Service: Gynecology;  Laterality: Bilateral;  . LUMBAR LAMINECTOMY  12/05/2008   L4 -- L5  . MICROTUBOPLASTY  2009   tubal ligation reversal  . ROBOTIC ASSISTED LAPAROSCOPIC CHOLECYSTECTOMY-MULTI SITE N/A 10/22/2018   Procedure: ROBOTIC ASSISTED LAPAROSCOPIC CHOLECYSTECTOMY-MULTI SITE;  Surgeon: Leafy Ro, MD;  Location: [facility] ORS;  Service: General;  Laterality: N/A;  . TUBAL LIGATION Bilateral 1998    Medical History: Past Medical History:  Diagnosis Date  . Allergic asthma, mild intermittent, uncomplicated   . Depression   . GERD (gastroesophageal reflux disease)   . Heart murmur    asymptomatic  . Hypertension   . Hypothyroidism   . Hypothyroidism   . Pelvic pain   . PONV (postoperative nausea and vomiting)    years ago  . Wears contact lenses     Family History: Family History  Adopted: Yes  Problem Relation Age of Onset  . Breast cancer Neg Hx     Social History   Socioeconomic History  . Marital status: Married     Spouse name: Not on file  . Number of children: Not on file  . Years of education: Not on file  . Highest education level: Not on file  Occupational History  . Not on file  Tobacco Use  . Smoking status: Never Smoker  . Smokeless tobacco: Never Used  Substance and Sexual Activity  . Alcohol use: Not Currently  . Drug use: No  . Sexual activity: Not on file  Other Topics Concern  . Not on file  Social History Narrative  . Not on file   Social Determinants of Health   Financial Resource Strain:   . Difficulty of Paying Living Expenses: Not on file  Food Insecurity:   . Worried About Programme researcher, broadcasting/film/video in the Last Year: Not on file  . Ran Out of Food in the Last Year: Not on file  Transportation Needs:   . Lack of Transportation (Medical): Not on file  . Lack of Transportation (Non-Medical): Not on file  Physical Activity:   . Days of Exercise per Week: Not on file  . Minutes of Exercise  per Session: Not on file  Stress:   . Feeling of Stress : Not on file  Social Connections:   . Frequency of Communication with Friends and Family: Not on file  . Frequency of Social Gatherings with Friends and Family: Not on file  . Attends Religious Services: Not on file  . Active Member of Clubs or Organizations: Not on file  . Attends Archivist Meetings: Not on file  . Marital Status: Not on file  Intimate Partner Violence:   . Fear of Current or Ex-Partner: Not on file  . Emotionally Abused: Not on file  . Physically Abused: Not on file  . Sexually Abused: Not on file      Review of Systems  Constitutional: Negative for chills, fatigue and unexpected weight change.  HENT: Negative for congestion, rhinorrhea, sneezing and sore throat.   Eyes: Negative for photophobia, pain and redness.  Respiratory: Positive for cough and chest tightness. Negative for shortness of breath.   Cardiovascular: Negative for chest pain and palpitations.  Gastrointestinal: Negative for  abdominal pain, constipation, diarrhea, nausea and vomiting.  Endocrine: Negative.   Genitourinary: Negative for dysuria and frequency.  Musculoskeletal: Negative for arthralgias, back pain, joint swelling and neck pain.  Skin: Negative for rash.  Allergic/Immunologic: Negative.   Neurological: Negative for tremors and numbness.  Hematological: Negative for adenopathy. Does not bruise/bleed easily.  Psychiatric/Behavioral: Negative for behavioral problems and sleep disturbance. The patient is not nervous/anxious.     Vital Signs: BP 131/90   Pulse 71   Temp 98.3 F (36.8 C)   LMP 09/19/2013   SpO2 97%    Observation/Objective:  Fair sounding, Some coughing while on phone.    Assessment/Plan: 1. COVID-19 virus infection Continues to recover from infection.  Continue to rest, treat symptoms, and return to clinic with new or worsening symptoms.   2. Mild intermittent asthma without complication Continue to use inhalers as directed.  3. Seasonal allergic rhinitis due to pollen Stable, continue present management.  4. Cough Reviewed risks and possible side effects associated with taking opiates, benzodiazepines and other CNS depressants. Combination of these could cause dizziness and drowsiness. Advised patient not to drive or operate machinery when taking these medications, as patient's and other's life can be at risk and will have consequences. Patient verbalized understanding in this matter. Dependence and abuse for these drugs will be monitored closely. A Controlled substance policy and procedure is on file which allows [facility] to order a urine drug screen test at any visit. Patient understands and agrees with the plan - benzonatate (TESSALON) 200 MG capsule; Take 1 capsule (200 mg total) by mouth 2 (two) times daily as needed for cough.  Dispense: 20 capsule; Refill: 0 - chlorpheniramine-HYDROcodone (TUSSIONEX PENNKINETIC ER) 10-8 MG/5ML SUER; Take 5 mLs by mouth  every 12 (twelve) hours as needed for cough.  Dispense: 140 mL; Refill: 0  General Counseling: Simrit verbalizes understanding of the findings of today's phone visit and agrees with plan of treatment. I have discussed any further diagnostic evaluation that may be needed or ordered today. We also reviewed her medications today. she has been encouraged to call the office with any questions or concerns that should arise related to todays visit.    No orders of the defined types were placed in this encounter.   Meds ordered this encounter  Medications  . benzonatate (TESSALON) 200 MG capsule    Sig: Take 1 capsule (200 mg total) by mouth 2 (two)  times daily as needed for cough.    Dispense:  20 capsule    Refill:  0  . chlorpheniramine-HYDROcodone (TUSSIONEX PENNKINETIC ER) 10-8 MG/5ML SUER    Sig: Take 5 mLs by mouth every 12 (twelve) hours as needed for cough.    Dispense:  140 mL    Refill:  0    Time spent: 15 Minutes    Blima Ledger AGNP-C Internal medicine

## 2020-01-10 ENCOUNTER — Encounter: Payer: Self-pay | Admitting: Internal Medicine

## 2020-01-10 ENCOUNTER — Ambulatory Visit (INDEPENDENT_AMBULATORY_CARE_PROVIDER_SITE_OTHER): Payer: BC Managed Care – PPO | Admitting: Internal Medicine

## 2020-01-10 VITALS — BP 129/91 | HR 105 | Ht 68.0 in | Wt 154.0 lb

## 2020-01-10 DIAGNOSIS — R05 Cough: Secondary | ICD-10-CM | POA: Diagnosis not present

## 2020-01-10 DIAGNOSIS — R059 Cough, unspecified: Secondary | ICD-10-CM

## 2020-01-10 DIAGNOSIS — I301 Infective pericarditis: Secondary | ICD-10-CM | POA: Diagnosis not present

## 2020-01-10 DIAGNOSIS — U071 COVID-19: Secondary | ICD-10-CM

## 2020-01-10 DIAGNOSIS — J4520 Mild intermittent asthma, uncomplicated: Secondary | ICD-10-CM | POA: Diagnosis not present

## 2020-01-10 NOTE — Progress Notes (Signed)
[facility] [place], [place] [address]  Internal MEDICINE  Telephone Visit  Patient Name: Vanessa Chapman  [id number]  [id number]  Date of Service: 01/10/2020  I connected with the patient at 1410 by telephone and verified the patients identity using two identifiers.   I discussed the limitations, risks, security and privacy concerns of performing an evaluation and management service by telephone and the availability of in person appointments. I also discussed with the patient that there may be a patient responsible charge related to the service.  The patient expressed understanding and agrees to proceed.    Chief Complaint  Patient presents with  . Telephone Assessment  . Telephone Screen  . Asthma  . Nasal Congestion  . Cough  . Shortness of Breath    HPI Patient for follow-up of increased shortness of breath.  Apparently she was seen in the emergency department was diagnosed with pericarditis and then went to see Dr. Neldon Newport shows for follow-up.  She was placed on colchicine it appears to be 0.6 mg twice a day.  She is already on metoprolol for her blood pressure issues.  Patient states that she has been improving although very slowly still has some periods of shortness of breath.  She denies having any active chest pain now as she does have occasional palpitations.  As far as her breathing is concerned otherwise pulmonary perspective she has cough for which she started taking some cough medication which she states also helped.  Last chest x-ray that she had done showed improvement.    Current Medication: Outpatient Encounter Medications as of 01/10/2020  Medication Sig  . albuterol (PROVENTIL HFA;VENTOLIN HFA) 108 (90 Base) MCG/ACT inhaler Inhale 1-2 puffs into the lungs every 6 (six) hours as needed for wheezing or shortness of breath.  . ALPRAZolam (XANAX) 1 MG tablet Take 1 mg by mouth at bedtime as needed for anxiety.  . benzonatate (TESSALON)  200 MG capsule Take 1 capsule (200 mg total) by mouth 2 (two) times daily as needed for cough.  . chlorpheniramine-HYDROcodone (TUSSIONEX PENNKINETIC ER) 10-8 MG/5ML SUER Take 5 mLs by mouth every 12 (twelve) hours as needed for cough.  Marland Kitchen DIVIGEL 1 MG/GM GEL Apply 1 application topically daily. Apply to thigh  . Eszopiclone 3 MG TABS Take 3 mg by mouth at bedtime.   . fluticasone (FLONASE) 50 MCG/ACT nasal spray Place 1 spray into both nostrils as needed for allergies or rhinitis.  . hydrochlorothiazide (HYDRODIURIL) 12.5 MG tablet Take 12.5 mg by mouth daily.  Fulton Reek 6.5 MG INST Place 1 application vaginally at bedtime.   . Ipratropium-Albuterol (COMBIVENT RESPIMAT) 20-100 MCG/ACT AERS respimat Inhale 1 puff into the lungs every 6 (six) hours as needed for wheezing or shortness of breath.  Marland Kitchen ipratropium-albuterol (DUONEB) 0.5-2.5 (3) MG/3ML SOLN Take 3 mLs by nebulization every 6 (six) hours as needed.  Marland Kitchen levothyroxine (SYNTHROID, LEVOTHROID) 100 MCG tablet Take 100 mcg by mouth daily before breakfast.  . lidocaine (LIDODERM) 5 % Place 1 patch onto the skin daily as needed (pain).   Marland Kitchen lidocaine (XYLOCAINE) 5 % ointment Apply 1 application topically daily as needed (pain).   . metoprolol succinate (TOPROL-XL) 25 MG 24 hr tablet Take 12.5 mg by mouth every morning.   . predniSONE (DELTASONE) 10 MG tablet Take  1 tab po 3 times for 2 days,1 tab po twice a day for 2 days and 1 tab po daily for 2 days  . promethazine (PHENERGAN) 12.5 MG  suppository Place 12.5 mg rectally every 6 (six) hours as needed for nausea or vomiting.   . traMADol (ULTRAM) 50 MG tablet Take 50 mg by mouth every 6 (six) hours as needed for moderate pain or severe pain.   Marland Kitchen omeprazole (PRILOSEC) 40 MG capsule Take 1 capsule (40 mg total) by mouth daily.   No facility-administered encounter medications on file as of 01/10/2020.    Surgical History: Past Surgical History:  Procedure Laterality Date  . ABDOMINAL HYSTERECTOMY     . BACK SURGERY    . BALLOON SINUPLASTY  10/2015  . BREAST ENHANCEMENT SURGERY  2008  . BREAST SURGERY    . CESAREAN SECTION  1996  . ENDOMETRIAL ABLATION W/ NOVASURE  01/2015  . ESOPHAGOGASTRODUODENOSCOPY (EGD) WITH PROPOFOL N/A 09/28/2018   Procedure: ESOPHAGOGASTRODUODENOSCOPY (EGD) WITH PROPOFOL;  Surgeon: Toney Reil, MD;  Location: [facility] ENDOSCOPY;  Service: Gastroenterology;  Laterality: N/A;  . EXCISIONAL HEMORRHOIDECTOMY  09/28/2006  . LAPAROSCOPIC VAGINAL HYSTERECTOMY WITH SALPINGECTOMY Bilateral 11/18/2016   Procedure: ATTEMPTED LAPAROSCOPIC ASSISTED VAGINAL HYSTERECTOMY WITH SALPINGECTOMY: LYSIS OF ADHESIONS ABDOMINAL HYSTERECTOMY Elsie Saas SALPIGECTOMY;  Surgeon: Richarda Overlie, MD;  Location: [facility];  Service: Gynecology;  Laterality: Bilateral;  . LUMBAR LAMINECTOMY  12/05/2008   L4 -- L5  . MICROTUBOPLASTY  2009   tubal ligation reversal  . ROBOTIC ASSISTED LAPAROSCOPIC CHOLECYSTECTOMY-MULTI SITE N/A 10/22/2018   Procedure: ROBOTIC ASSISTED LAPAROSCOPIC CHOLECYSTECTOMY-MULTI SITE;  Surgeon: Leafy Ro, MD;  Location: [facility] ORS;  Service: General;  Laterality: N/A;  . TUBAL LIGATION Bilateral 1998    Medical History: Past Medical History:  Diagnosis Date  . Allergic asthma, mild intermittent, uncomplicated   . Depression   . GERD (gastroesophageal reflux disease)   . Heart murmur    asymptomatic  . Hypertension   . Hypothyroidism   . Hypothyroidism   . Pelvic pain   . PONV (postoperative nausea and vomiting)    years ago  . Wears contact lenses     Family History: Family History  Adopted: Yes  Problem Relation Age of Onset  . Breast cancer Neg Hx     Social History   Socioeconomic History  . Marital status: Married    Spouse name: Not on file  . Number of children: Not on file  . Years of education: Not on file  . Highest education level: Not on file  Occupational History  . Not on file  Tobacco Use  . Smoking status:  Never Smoker  . Smokeless tobacco: Never Used  Substance and Sexual Activity  . Alcohol use: Not Currently  . Drug use: No  . Sexual activity: Not on file  Other Topics Concern  . Not on file  Social History Narrative  . Not on file   Social Determinants of Health   Financial Resource Strain:   . Difficulty of Paying Living Expenses: Not on file  Food Insecurity:   . Worried About Programme researcher, broadcasting/film/video in the Last Year: Not on file  . Ran Out of Food in the Last Year: Not on file  Transportation Needs:   . Lack of Transportation (Medical): Not on file  . Lack of Transportation (Non-Medical): Not on file  Physical Activity:   . Days of Exercise per Week: Not on file  . Minutes of Exercise per Session: Not on file  Stress:   . Feeling of Stress : Not on file  Social Connections:   . Frequency of Communication with Friends and Family: Not on  file  . Frequency of Social Gatherings with Friends and Family: Not on file  . Attends Religious Services: Not on file  . Active Member of Clubs or Organizations: Not on file  . Attends Banker Meetings: Not on file  . Marital Status: Not on file  Intimate Partner Violence:   . Fear of Current or Ex-Partner: Not on file  . Emotionally Abused: Not on file  . Physically Abused: Not on file  . Sexually Abused: Not on file      Review of Systems  Admits to cough and some congestion.  Mostly nasal and upper airway. No chest pain some palpitations are noted Denies having any nausea no vomiting or diarrhea is noted. Denies joint pain. No fevers or chills  Vital Signs: BP (!) 129/91   Pulse (!) 105   Ht 5\' 8"  (1.727 m)   Wt 154 lb (69.9 kg)   LMP 09/19/2013   SpO2 94%   BMI 23.42 kg/m    Observation/Objective: Appeared to be without any acute distress right now she is actually doing relatively well    Assessment/Plan: #1 acute pericarditis patient is on colchicine she will continue to follow-up with cardiology we  will continue to monitor closely response to therapy.  She will call her primary cardiologist to sort out the exact dosage of her colchicine.  In addition she is on metoprolol which will be continued.  2.  COVID-19 virus infection now in resolution phase we will continue to follow her.  She was advised about getting vaccinated once she settles down as far as her acute symptoms are concerned.  3.  Cough symptomatic treatment appears to be under better control she will continue with the current recommended therapy.  4.  Mild obstructive asthma she will continue with her inhalers right now is under control.  I do not see a requirement for steroids.  Last chest x-ray did not show any infiltrates   General Counseling: brizeida mcmurry understanding of the findings of today's phone visit and agrees with plan of treatment. I have discussed any further diagnostic evaluation that may be needed or ordered today. We also reviewed her medications today. she has been encouraged to call the office with any questions or concerns that should arise related to todays visit.    No orders of the defined types were placed in this encounter.   No orders of the defined types were placed in this encounter.   Time spent: 30 minutes minutes    Kathyrn Lass MD [facility] Pulmonary Medicine

## 2020-01-10 NOTE — Patient Instructions (Signed)
Asthma, Adult  Asthma is a long-term (chronic) condition in which the airways get tight and narrow. The airways are the breathing passages that lead from the nose and mouth down into the lungs. A person with asthma will have times when symptoms get worse. These are called asthma attacks. They can cause coughing, whistling sounds when you breathe (wheezing), shortness of breath, and chest pain. They can make it hard to breathe. There is no cure for asthma, but medicines and lifestyle changes can help control it. There are many things that can bring on an asthma attack or make asthma symptoms worse (triggers). Common triggers include:  Mold.  Dust.  Cigarette smoke.  Cockroaches.  Things that can cause allergy symptoms (allergens). These include animal skin flakes (dander) and pollen from trees or grass.  Things that pollute the air. These may include household cleaners, wood smoke, smog, or chemical odors.  Cold air, weather changes, and wind.  Crying or laughing hard.  Stress.  Certain medicines or drugs.  Certain foods such as dried fruit, potato chips, and grape juice.  Infections, such as a cold or the flu.  Certain medical conditions or diseases.  Exercise or tiring activities. Asthma may be treated with medicines and by staying away from the things that cause asthma attacks. Types of medicines may include:  Controller medicines. These help prevent asthma symptoms. They are usually taken every day.  Fast-acting reliever or rescue medicines. These quickly relieve asthma symptoms. They are used as needed and provide short-term relief.  Allergy medicines if your attacks are brought on by allergens.  Medicines to help control the body's defense (immune) system. Follow these instructions at home: Avoiding triggers in your home  Change your heating and air conditioning filter often.  Limit your use of fireplaces and wood stoves.  Get rid of pests (such as roaches and  mice) and their droppings.  Throw away plants if you see mold on them.  Clean your floors. Dust regularly. Use cleaning products that do not smell.  Have someone vacuum when you are not home. Use a vacuum cleaner with a HEPA filter if possible.  Replace carpet with wood, tile, or vinyl flooring. Carpet can trap animal skin flakes and dust.  Use allergy-proof pillows, mattress covers, and box spring covers.  Wash bed sheets and blankets every week in hot water. Dry them in a dryer.  Keep your bedroom free of any triggers.  Avoid pets and keep windows closed when things that cause allergy symptoms are in the air.  Use blankets that are made of polyester or cotton.  Clean bathrooms and kitchens with bleach. If possible, have someone repaint the walls in these rooms with mold-resistant paint. Keep out of the rooms that are being cleaned and painted.  Wash your hands often with soap and water. If soap and water are not available, use hand sanitizer.  Do not allow anyone to smoke in your home. General instructions  Take over-the-counter and prescription medicines only as told by your doctor. ? Talk with your doctor if you have questions about how or when to take your medicines. ? Make note if you need to use your medicines more often than usual.  Do not use any products that contain nicotine or tobacco, such as cigarettes and e-cigarettes. If you need help quitting, ask your doctor.  Stay away from secondhand smoke.  Avoid doing things outdoors when allergen counts are high and when air quality is low.  Wear a ski mask   when doing outdoor activities in the winter. The mask should cover your nose and mouth. Exercise indoors on cold days if you can.  Warm up before you exercise. Take time to cool down after exercise.  Use a peak flow meter as told by your doctor. A peak flow meter is a tool that measures how well the lungs are working.  Keep track of the peak flow meter's readings.  Write them down.  Follow your asthma action plan. This is a written plan for taking care of your asthma and treating your attacks.  Make sure you get all the shots (vaccines) that your doctor recommends. Ask your doctor about a flu shot and a pneumonia shot.  Keep all follow-up visits as told by your doctor. This is important. Contact a doctor if:  You have wheezing, shortness of breath, or a cough even while taking medicine to prevent attacks.  The mucus you cough up (sputum) is thicker than usual.  The mucus you cough up changes from clear or white to yellow, green, gray, or bloody.  You have problems from the medicine you are taking, such as: ? A rash. ? Itching. ? Swelling. ? Trouble breathing.  You need reliever medicines more than 2-3 times a week.  Your peak flow reading is still at 50-79% of your personal best after following the action plan for 1 hour.  You have a fever. Get help right away if:  You seem to be worse and are not responding to medicine during an asthma attack.  You are short of breath even at rest.  You get short of breath when doing very little activity.  You have trouble eating, drinking, or talking.  You have chest pain or tightness.  You have a fast heartbeat.  Your lips or fingernails start to turn blue.  You are light-headed or dizzy, or you faint.  Your peak flow is less than 50% of your personal best.  You feel too tired to breathe normally. Summary  Asthma is a long-term (chronic) condition in which the airways get tight and narrow. An asthma attack can make it hard to breathe.  Asthma cannot be cured, but medicines and lifestyle changes can help control it.  Make sure you understand how to avoid triggers and how and when to use your medicines. This information is not intended to replace advice given to you by your health care provider. Make sure you discuss any questions you have with your health care provider. Document Revised:  02/03/2019 Document Reviewed: 01/05/2017 Elsevier Patient Education  2020 Elsevier Inc.  

## 2020-01-16 ENCOUNTER — Other Ambulatory Visit: Payer: Self-pay | Admitting: Internal Medicine

## 2020-01-31 ENCOUNTER — Other Ambulatory Visit: Payer: Self-pay | Admitting: Adult Health

## 2020-01-31 DIAGNOSIS — R059 Cough, unspecified: Secondary | ICD-10-CM

## 2020-01-31 DIAGNOSIS — R05 Cough: Secondary | ICD-10-CM

## 2020-03-11 ENCOUNTER — Other Ambulatory Visit: Payer: Self-pay | Admitting: Internal Medicine

## 2020-03-19 HISTORY — PX: BREAST BIOPSY: SHX20

## 2020-04-27 ENCOUNTER — Telehealth: Payer: Self-pay

## 2020-04-27 NOTE — Telephone Encounter (Signed)
Patient rescheduled appointment on 05/01/2020 to 05/28/2020. klh

## 2020-05-01 ENCOUNTER — Ambulatory Visit: Payer: BC Managed Care – PPO | Admitting: Internal Medicine

## 2020-05-24 ENCOUNTER — Telehealth: Payer: Self-pay

## 2020-05-24 NOTE — Telephone Encounter (Signed)
Called lmom informing patient of appointment on 05/28/2020. klh 

## 2020-05-24 NOTE — Telephone Encounter (Signed)
Confirmed appointment on 05/28/2020. klh

## 2020-05-28 ENCOUNTER — Encounter: Payer: Self-pay | Admitting: Internal Medicine

## 2020-05-28 ENCOUNTER — Other Ambulatory Visit: Payer: Self-pay

## 2020-05-28 ENCOUNTER — Ambulatory Visit: Payer: BC Managed Care – PPO | Admitting: Internal Medicine

## 2020-05-28 VITALS — HR 88 | Temp 97.7°F | Resp 16 | Ht 68.0 in | Wt 149.0 lb

## 2020-05-28 DIAGNOSIS — I301 Infective pericarditis: Secondary | ICD-10-CM

## 2020-05-28 DIAGNOSIS — R0602 Shortness of breath: Secondary | ICD-10-CM | POA: Diagnosis not present

## 2020-05-28 DIAGNOSIS — J4520 Mild intermittent asthma, uncomplicated: Secondary | ICD-10-CM

## 2020-05-28 DIAGNOSIS — U071 COVID-19: Secondary | ICD-10-CM

## 2020-05-28 DIAGNOSIS — F419 Anxiety disorder, unspecified: Secondary | ICD-10-CM

## 2020-05-28 NOTE — Progress Notes (Signed)
[facility] [address] [place], [place] [address]  Pulmonary Sleep Medicine   Office Visit Note  Patient Name: Vanessa Chapman DOB: Jun 26, 1971 MRN [id number]  Date of Service: 05/28/2020  Complaints/HPI: She is here for follow-up post Covid.  She states that she is slowly improving she has had still some issues with secretions.  She does get episodes where she has thick stringy secretions coming out.  She feels that drinking water keeping herself hydrated may help.  I did stress to her that it is an improvement for her to continue to keep herself very well-hydrated.  She does experiencing still some shortness of breath.  Denies having any fever or chills she does have some tightness in her chest.  In addition she was actually quite weepy.  She feels more depressed than anything else.  She also stated to me that she has been on Xanax for very long time but has not really been discussed with her as far as going on some type of antidepressant.  It should be noted that she is also postmenopausal and has been dealing with that point so.  I did suggest to her that sometimes an antidepressant can also help with the menopausal issues.  ROS  General: (-) fever, (-) chills, (-) night sweats, (-) weakness Skin: (-) rashes, (-) itching,. Eyes: (-) visual changes, (-) redness, (-) itching. Nose and Sinuses: (-) nasal stuffiness or itchiness, (-) postnasal drip, (-) nosebleeds, (-) sinus trouble. Mouth and Throat: (-) sore throat, (-) hoarseness. Neck: (-) swollen glands, (-) enlarged thyroid, (-) neck pain. Respiratory: + cough, (-) bloody sputum, + shortness of breath, - wheezing. Cardiovascular: - ankle swelling, (-) chest pain. Lymphatic: (-) lymph node enlargement. Neurologic: (-) numbness, (-) tingling. Psychiatric: (-) anxiety, (-) depression   Current Medication: Outpatient Encounter Medications as of 05/28/2020  Medication Sig  . albuterol (PROVENTIL HFA;VENTOLIN  HFA) 108 (90 Base) MCG/ACT inhaler Inhale 1-2 puffs into the lungs every 6 (six) hours as needed for wheezing or shortness of breath.  . ALPRAZolam (XANAX) 1 MG tablet Take 1 mg by mouth at bedtime as needed for anxiety.  . benzonatate (TESSALON) 200 MG capsule TAKE 1 CAPSULE(200 MG) BY MOUTH TWICE DAILY AS NEEDED FOR COUGH  . chlorpheniramine-HYDROcodone (TUSSIONEX PENNKINETIC ER) 10-8 MG/5ML SUER Take 5 mLs by mouth every 12 (twelve) hours as needed for cough.  . COMBIVENT RESPIMAT 20-100 MCG/ACT AERS respimat INHALE 1 PUFF INTO THE LUNGS EVERY 6 HOURS AS NEEDED FOR WHEEZING OR SHORTNESS OF BREATH  . DIVIGEL 1 MG/GM GEL Apply 1 application topically daily. Apply to thigh  . Eszopiclone 3 MG TABS Take 3 mg by mouth at bedtime.   . fluticasone (FLONASE) 50 MCG/ACT nasal spray Place 1 spray into both nostrils as needed for allergies or rhinitis.  . hydrochlorothiazide (HYDRODIURIL) 12.5 MG tablet Take 12.5 mg by mouth daily.  [place] Reek 6.5 MG INST Place 1 application vaginally at bedtime.   Marland Kitchen ipratropium-albuterol (DUONEB) 0.5-2.5 (3) MG/3ML SOLN Take 3 mLs by nebulization every 6 (six) hours as needed.  Marland Kitchen levothyroxine (SYNTHROID, LEVOTHROID) 100 MCG tablet Take 100 mcg by mouth daily before breakfast.  . lidocaine (LIDODERM) 5 % Place 1 patch onto the skin daily as needed (pain).   Marland Kitchen lidocaine (XYLOCAINE) 5 % ointment Apply 1 application topically daily as needed (pain).   . metoprolol succinate (TOPROL-XL) 25 MG 24 hr tablet Take 12.5 mg by mouth every morning.   . predniSONE (DELTASONE) 10 MG tablet Take  1 tab po 3 times for 2 days,1 tab po twice a day for 2 days and 1 tab po daily for 2 days  . promethazine (PHENERGAN) 12.5 MG suppository Place 12.5 mg rectally every 6 (six) hours as needed for nausea or vomiting.   . traMADol (ULTRAM) 50 MG tablet Take 50 mg by mouth every 6 (six) hours as needed for moderate pain or severe pain.   Marland Kitchen omeprazole (PRILOSEC) 40 MG capsule Take 1 capsule (40 mg  total) by mouth daily.   No facility-administered encounter medications on file as of 05/28/2020.    Surgical History: Past Surgical History:  Procedure Laterality Date  . ABDOMINAL HYSTERECTOMY    . BACK SURGERY    . BALLOON SINUPLASTY  10/2015  . BREAST ENHANCEMENT SURGERY  2008  . BREAST SURGERY    . CESAREAN SECTION  1996  . ENDOMETRIAL ABLATION W/ NOVASURE  01/2015  . ESOPHAGOGASTRODUODENOSCOPY (EGD) WITH PROPOFOL N/A 09/28/2018   Procedure: ESOPHAGOGASTRODUODENOSCOPY (EGD) WITH PROPOFOL;  Surgeon: Toney Reil, MD;  Location: [facility] ENDOSCOPY;  Service: Gastroenterology;  Laterality: N/A;  . EXCISIONAL HEMORRHOIDECTOMY  09/28/2006  . LAPAROSCOPIC VAGINAL HYSTERECTOMY WITH SALPINGECTOMY Bilateral 11/18/2016   Procedure: ATTEMPTED LAPAROSCOPIC ASSISTED VAGINAL HYSTERECTOMY WITH SALPINGECTOMY: LYSIS OF ADHESIONS ABDOMINAL HYSTERECTOMY Elsie Saas SALPIGECTOMY;  Surgeon: Richarda Overlie, MD;  Location[facility];  Service: Gynecology;  Laterality: Bilateral;  . LUMBAR LAMINECTOMY  12/05/2008   L4 -- L5  . MICROTUBOPLASTY  2009   tubal ligation reversal  . ROBOTIC ASSISTED LAPAROSCOPIC CHOLECYSTECTOMY-MULTI SITE N/A 10/22/2018   Procedure: ROBOTIC ASSISTED LAPAROSCOPIC CHOLECYSTECTOMY-MULTI SITE;  Surgeon: Leafy Ro, MD;  Location: [facility] ORS;  Service: General;  Laterality: N/A;  . TUBAL LIGATION Bilateral 1998    Medical History: Past Medical History:  Diagnosis Date  . Allergic asthma, mild intermittent, uncomplicated   . Depression   . GERD (gastroesophageal reflux disease)   . Heart murmur    asymptomatic  . Hypertension   . Hypothyroidism   . Hypothyroidism   . Pelvic pain   . PONV (postoperative nausea and vomiting)    years ago  . Wears contact lenses     Family History: Family History  Adopted: Yes  Problem Relation Age of Onset  . Breast cancer Neg Hx     Social History: Social History   Socioeconomic History  . Marital status:  Married    Spouse name: Not on file  . Number of children: Not on file  . Years of education: Not on file  . Highest education level: Not on file  Occupational History  . Not on file  Tobacco Use  . Smoking status: Never Smoker  . Smokeless tobacco: Never Used  Vaping Use  . Vaping Use: Never used  Substance and Sexual Activity  . Alcohol use: Not Currently  . Drug use: No  . Sexual activity: Not on file  Other Topics Concern  . Not on file  Social History Narrative  . Not on file   Social Determinants of Health   Financial Resource Strain:   . Difficulty of Paying Living Expenses:   Food Insecurity:   . Worried About Programme researcher, broadcasting/film/video in the Last Year:   . Barista in the Last Year:   Transportation Needs:   . Freight forwarder (Medical):   Marland Kitchen Lack of Transportation (Non-Medical):   Physical Activity:   . Days of Exercise per Week:   . Minutes of Exercise per Session:  Stress:   . Feeling of Stress :   Social Connections:   . Frequency of Communication with Friends and Family:   . Frequency of Social Gatherings with Friends and Family:   . Attends Religious Services:   . Active Member of Clubs or Organizations:   . Attends Banker Meetings:   Marland Kitchen Marital Status:   Intimate Partner Violence:   . Fear of Current or Ex-Partner:   . Emotionally Abused:   Marland Kitchen Physically Abused:   . Sexually Abused:     Vital Signs: Pulse 88, temperature 97.7 F (36.5 C), resp. rate 16, height 5\' 8"  (1.727 m), weight 149 lb (67.6 kg), last menstrual period 09/19/2013, SpO2 100 %.  Examination: General Appearance: The patient is well-developed, well-nourished, and in no distress. Skin: Gross inspection of skin unremarkable. Head: normocephalic, no gross deformities. Eyes: no gross deformities noted. ENT: ears appear grossly normal no exudates. Neck: Supple. No thyromegaly. No LAD. Respiratory: no rhonchi noted. Cardiovascular: Normal S1 and S2 without  murmur or rub. Extremities: No cyanosis. pulses are equal. Neurologic: Alert and oriented. No involuntary movements.  LABS: No results found for this or any previous visit (from the past 2160 hour(s)).  Radiology: DG Chest 2 View  Result Date: 12/08/2019 CLINICAL DATA:  Chest pain EXAM: CHEST - 2 VIEW COMPARISON:  November 21, 2019 FINDINGS: The lungs are clear. Heart size and pulmonary vascularity are normal. No adenopathy. No pneumothorax. No bone lesions. IMPRESSION: No edema or consolidation.  No evident adenopathy. Electronically Signed   By: November 23, 2019 III M.D.   On: 12/08/2019 11:32   CT Angio Chest PE W/Cm &/Or Wo Cm  Result Date: 12/08/2019 CLINICAL DATA:  Pleuritic chest pain and shortness of breath. Back pain. COVID-19 positive on 11/14/2019. EXAM: CT ANGIOGRAPHY CHEST WITH CONTRAST TECHNIQUE: Multidetector CT imaging of the chest was performed using the standard protocol during bolus administration of intravenous contrast. Multiplanar CT image reconstructions and MIPs were obtained to evaluate the vascular anatomy. CONTRAST:  18mL OMNIPAQUE IOHEXOL 350 MG/ML SOLN COMPARISON:  Chest radiographs obtained earlier today. Chest and abdomen CT dated 10/20/2013. FINDINGS: Cardiovascular: Satisfactory opacification of the pulmonary arteries to the segmental level. No evidence of pulmonary embolism. Normal heart size. No pericardial effusion. Mediastinum/Nodes: No enlarged mediastinal, hilar, or axillary lymph nodes. Thyroid gland, trachea, and esophagus demonstrate no significant findings. Lungs/Pleura: Lungs are clear. No pleural effusion or pneumothorax. Upper Abdomen: Unremarkable. Musculoskeletal: Mild thoracic spine degenerative changes. Bilateral retropectoral saline breast implants. Review of the MIP images confirms the above findings. IMPRESSION: No pulmonary emboli or acute abnormality. Electronically Signed   By: 13/05/2013 M.D.   On: 12/08/2019 14:05    No results  found.  No results found.    Assessment and Plan: Patient Active Problem List   Diagnosis Date Noted  . Biliary dyskinesia   . Mild intermittent asthma without complication 04/06/2018  . Seasonal allergic rhinitis due to pollen 04/06/2018  . Chronic pansinusitis 04/06/2018  . Chronic pelvic pain in female 01/08/2018  . Chronic pain syndrome 07/23/2017  . Left lower quadrant pain 05/27/2017  . Pelvic pain 11/18/2016    1. Pericarditis off colchicine now. Following with her cardiologist. She states that she has some more symptoms. Has not had a follow up echo done to reassess. Since she is having more symptoms wpuld suggest a follow up echo. She has pain and also has difficulty laying flat and has been sleeping in a couch 2. Covid 19 recovery phase  slow to improve will continue wto monitor 3. Cough improving slowly at some point will need follow up PFT 4. Mild Asthma will as above need a follow up  5. Anxiety she is on xanax per her PCP  General Counseling: I have discussed the findings of the evaluation and examination with Clydie Braun.  I have also discussed any further diagnostic evaluation thatmay be needed or ordered today. Raneisha verbalizes understanding of the findings of todays visit. We also reviewed her medications today and discussed drug interactions and side effects including but not limited excessive drowsiness and altered mental states. We also discussed that there is always a risk not just to her but also people around her. she has been encouraged to call the office with any questions or concerns that should arise related to todays visit.  Orders Placed This Encounter  Procedures  . ECHOCARDIOGRAM COMPLETE    Standing Status:   Future    Standing Expiration Date:   05/28/2021    Order Specific Question:   Where should this test be performed    Answer:   External    Order Specific Question:   Perflutren DEFINITY (image enhancing agent) should be administered unless  hypersensitivity or allergy exist    Answer:   Administer Perflutren    Order Specific Question:   Reason for exam-Echo    Answer:   Dyspnea  786.09 / R06.00  . Spirometry with Graph    Order Specific Question:   Where should this test be performed?    Answer:   Other  . Pulmonary function test    Standing Status:   Future    Standing Expiration Date:   05/28/2021    Order Specific Question:   Where should this test be performed?    Answer:   [facility]     Time spent: 66  I have personally obtained a history, examined the patient, evaluated laboratory and imaging results, formulated the assessment and plan and placed orders.    Yevonne Pax, MD [facility] Pulmonary and Critical Care Sleep medicine

## 2020-05-28 NOTE — Patient Instructions (Signed)
Echocardiogram An echocardiogram is a procedure that uses painless sound waves (ultrasound) to produce an image of the heart. Images from an echocardiogram can provide important information about:  Signs of coronary artery disease (CAD).  Aneurysm detection. An aneurysm is a weak or damaged part of an artery wall that bulges out from the normal force of blood pumping through the body.  Heart size and shape. Changes in the size or shape of the heart can be associated with certain conditions, including heart failure, aneurysm, and CAD.  Heart muscle function.  Heart valve function.  Signs of a past heart attack.  Fluid buildup around the heart.  Thickening of the heart muscle.  A tumor or infectious growth around the heart valves. Tell a health care provider about:  Any allergies you have.  All medicines you are taking, including vitamins, herbs, eye drops, creams, and over-the-counter medicines.  Any blood disorders you have.  Any surgeries you have had.  Any medical conditions you have.  Whether you are pregnant or may be pregnant. What are the risks? Generally, this is a safe procedure. However, problems may occur, including:  Allergic reaction to dye (contrast) that may be used during the procedure. What happens before the procedure? No specific preparation is needed. You may eat and drink normally. What happens during the procedure?   An IV tube may be inserted into one of your veins.  You may receive contrast through this tube. A contrast is an injection that improves the quality of the pictures from your heart.  A gel will be applied to your chest.  A wand-like tool (transducer) will be moved over your chest. The gel will help to transmit the sound waves from the transducer.  The sound waves will harmlessly bounce off of your heart to allow the heart images to be captured in real-time motion. The images will be recorded on a computer. The procedure may vary  among health care providers and hospitals. What happens after the procedure?  You may return to your normal, everyday life, including diet, activities, and medicines, unless your health care provider tells you not to do that. Summary  An echocardiogram is a procedure that uses painless sound waves (ultrasound) to produce an image of the heart.  Images from an echocardiogram can provide important information about the size and shape of your heart, heart muscle function, heart valve function, and fluid buildup around your heart.  You do not need to do anything to prepare before this procedure. You may eat and drink normally.  After the echocardiogram is completed, you may return to your normal, everyday life, unless your health care provider tells you not to do that. This information is not intended to replace advice given to you by your health care provider. Make sure you discuss any questions you have with your health care provider. Document Revised: 03/24/2019 Document Reviewed: 01/03/2017 Elsevier Patient Education  2020 Elsevier Inc.  

## 2020-06-05 ENCOUNTER — Telehealth: Payer: Self-pay

## 2020-06-05 NOTE — Telephone Encounter (Signed)
Confirmed PFT appt

## 2020-06-06 ENCOUNTER — Other Ambulatory Visit: Payer: Self-pay

## 2020-06-06 ENCOUNTER — Ambulatory Visit (INDEPENDENT_AMBULATORY_CARE_PROVIDER_SITE_OTHER): Payer: BC Managed Care – PPO | Admitting: Internal Medicine

## 2020-06-06 DIAGNOSIS — R0602 Shortness of breath: Secondary | ICD-10-CM

## 2020-06-06 LAB — PULMONARY FUNCTION TEST

## 2020-06-12 ENCOUNTER — Telehealth: Payer: Self-pay

## 2020-06-12 NOTE — Telephone Encounter (Signed)
Pt called that she did PFT need result we make her follow up appt because still little congestion  Advised her just follow what dr Welton Flakes discuss with also use nasal spray and feeling better we make her appt

## 2020-06-15 ENCOUNTER — Ambulatory Visit: Payer: BC Managed Care – PPO

## 2020-06-15 ENCOUNTER — Other Ambulatory Visit: Payer: Self-pay

## 2020-06-15 DIAGNOSIS — R0602 Shortness of breath: Secondary | ICD-10-CM

## 2020-06-17 ENCOUNTER — Other Ambulatory Visit: Payer: Self-pay | Admitting: Adult Health

## 2020-06-21 NOTE — Procedures (Signed)
[facility] [address] [place], [address]  DATE OF SERVICE: June 06, 2020  Complete Pulmonary Function Testing Interpretation:  FINDINGS:  The forced vital capacity is normal.  FEV1 is 1.69 L which is 63% of predicted and is moderately decreased.  FEV1 FVC ratio is moderately decreased.  Postbronchodilator there is no significant change in the FEV1 however clinical improvement may still occur in the absence of spirometric improvement.  Total lung capacity is increased residual volume is increased residual internal capacity ratio is increased FRC is increased.  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  There does not appear to be correlation with bronchodilators there is also hyperinflation noted.  Yevonne Pax, MD [facility] Pulmonary Critical Care Medicine Sleep Medicine

## 2020-06-22 ENCOUNTER — Telehealth: Payer: Self-pay

## 2020-06-22 NOTE — Telephone Encounter (Signed)
Called lmom informing patient of appointment on 06/26/2020. klh °

## 2020-06-23 ENCOUNTER — Emergency Department: Payer: BC Managed Care – PPO

## 2020-06-23 ENCOUNTER — Other Ambulatory Visit: Payer: Self-pay

## 2020-06-23 DIAGNOSIS — Z79899 Other long term (current) drug therapy: Secondary | ICD-10-CM | POA: Diagnosis not present

## 2020-06-23 DIAGNOSIS — B3323 Viral pericarditis: Secondary | ICD-10-CM | POA: Insufficient documentation

## 2020-06-23 DIAGNOSIS — E039 Hypothyroidism, unspecified: Secondary | ICD-10-CM | POA: Insufficient documentation

## 2020-06-23 DIAGNOSIS — Z7951 Long term (current) use of inhaled steroids: Secondary | ICD-10-CM | POA: Insufficient documentation

## 2020-06-23 DIAGNOSIS — R079 Chest pain, unspecified: Secondary | ICD-10-CM | POA: Diagnosis present

## 2020-06-23 DIAGNOSIS — I10 Essential (primary) hypertension: Secondary | ICD-10-CM | POA: Insufficient documentation

## 2020-06-23 LAB — BASIC METABOLIC PANEL WITH GFR
Anion gap: 9 (ref 5–15)
BUN: 9 mg/dL (ref 6–20)
CO2: 30 mmol/L (ref 22–32)
Calcium: 9.3 mg/dL (ref 8.9–10.3)
Chloride: 102 mmol/L (ref 98–111)
Creatinine, Ser: 0.93 mg/dL (ref 0.44–1.00)
GFR calc Af Amer: >60 mL/min
GFR calc non Af Amer: >60 mL/min
Glucose, Bld: 100 mg/dL — ABNORMAL HIGH (ref 70–99)
Potassium: 3.3 mmol/L — ABNORMAL LOW (ref 3.5–5.1)
Sodium: 141 mmol/L (ref 135–145)

## 2020-06-23 LAB — CBC
HCT: 39.9 % (ref 36.0–46.0)
Hemoglobin: 13.3 g/dL (ref 12.0–15.0)
MCH: 31 pg (ref 26.0–34.0)
MCHC: 33.3 g/dL (ref 30.0–36.0)
MCV: 93 fL (ref 80.0–100.0)
Platelets: 246 10*3/uL (ref 150–400)
RBC: 4.29 MIL/uL (ref 3.87–5.11)
RDW: 12.4 % (ref 11.5–15.5)
WBC: 5.8 10*3/uL (ref 4.0–10.5)
nRBC: 0 % (ref 0.0–0.2)

## 2020-06-23 LAB — TROPONIN I (HIGH SENSITIVITY): Troponin I (High Sensitivity): 5 ng/L

## 2020-06-23 MED ORDER — SODIUM CHLORIDE 0.9% FLUSH: 3.0000 mL | Freq: Once | INTRAVENOUS | Status: DC

## 2020-06-23 NOTE — ED Triage Notes (Signed)
Pt arrives via POV for reports of chest pain and pain between shoulder blades intermittently over the last month. PT reports pain worsened today and she feels like her chest is tight. Pt speaking with this RN in complete sentences without shob. Pt states she had a spirometry test this past week and it was normal. Pt also reports she had pericarditis in December. Pt's skin warm and dry, NAD

## 2020-06-24 ENCOUNTER — Emergency Department: Payer: BC Managed Care – PPO

## 2020-06-24 ENCOUNTER — Emergency Department
Admission: EM | Admit: 2020-06-24 | Discharge: 2020-06-24 | Disposition: A | Discharge status: Home / Self Care | Payer: BC Managed Care – PPO | Attending: Emergency Medicine | Admitting: Emergency Medicine

## 2020-06-24 ENCOUNTER — Encounter: Payer: Self-pay | Admitting: Radiology

## 2020-06-24 DIAGNOSIS — B3323 Viral pericarditis: Secondary | ICD-10-CM

## 2020-06-24 LAB — TROPONIN I (HIGH SENSITIVITY): Troponin I (High Sensitivity): 5 ng/L (ref <18)

## 2020-06-24 MED ORDER — ONDANSETRON HCL 4 MG/2ML IJ SOLN: 4.0000 mg | Freq: Once | INTRAMUSCULAR | Status: DC

## 2020-06-24 MED ORDER — IOHEXOL 350 MG/ML SOLN
75.0000 mL | Freq: Once | INTRAVENOUS | Status: AC | PRN
  Administered 2020-06-24: 75 mL via INTRAVENOUS

## 2020-06-24 MED ORDER — KETOROLAC TROMETHAMINE 30 MG/ML IJ SOLN
15.0000 mg | Freq: Once | INTRAMUSCULAR | Status: AC
  Administered 2020-06-24: 15 mg via INTRAVENOUS
  Filled 2020-06-24: qty 1

## 2020-06-24 MED ORDER — FENTANYL CITRATE (PF) 100 MCG/2ML IJ SOLN: 50.0000 ug | Freq: Once | INTRAMUSCULAR | Status: DC

## 2020-06-24 NOTE — ED Provider Notes (Signed)
[facility] Emergency Department Provider Note  ____________________________________________  Time seen: Approximately 2:52 AM  I have reviewed the triage vital signs and the nursing notes.   HISTORY  Chief Complaint Chest Pain   HPI Vanessa Chapman is a 49 y.o. female the history of asthma, hypothyroidism, pericarditis after Covid infection who presents for evaluation of chest pain.  Patient had a prolonged episode of pericarditis from Covid.  Was taken off of colchicine and NSAIDs in April by her cardiologist.  About a month ago she started having pleuritic chest pain and was restarted on colchicine.  This evening she reports that the pain was more pronounced.  The pain is sharp, located in her chest and between her shoulder blades, worse when she lays flat and better when she leans forward.  She reports that the pain is identical to her prior episodes of pericarditis.  Pain is currently 5 out of 10.  She has had a chronic cough since having Covid.  She has had no fever.  She was vaccinated for Covid with the second shot in April.  She does take estrogen supplementation.  She denies any personal or family history of blood clots, leg pain or swelling or hemoptysis.  No recent prolonged trips.   Past Medical History:  Diagnosis Date  . Allergic asthma, mild intermittent, uncomplicated   . Depression   . GERD (gastroesophageal reflux disease)   . Heart murmur    asymptomatic  . Hypertension   . Hypothyroidism   . Hypothyroidism   . Pelvic pain   . PONV (postoperative nausea and vomiting)    years ago  . Wears contact lenses     Patient Active Problem List   Diagnosis Date Noted  . Biliary dyskinesia   . Mild intermittent asthma without complication 04/06/2018  . Seasonal allergic rhinitis due to pollen 04/06/2018  . Chronic pansinusitis 04/06/2018  . Chronic pelvic pain in female 01/08/2018  . Chronic pain syndrome 07/23/2017  . Left lower  quadrant pain 05/27/2017  . Pelvic pain 11/18/2016    Past Surgical History:  Procedure Laterality Date  . ABDOMINAL HYSTERECTOMY    . BACK SURGERY    . BALLOON SINUPLASTY  10/2015  . BREAST ENHANCEMENT SURGERY  2008  . BREAST SURGERY    . CESAREAN SECTION  1996  . ENDOMETRIAL ABLATION W/ NOVASURE  01/2015  . ESOPHAGOGASTRODUODENOSCOPY (EGD) WITH PROPOFOL N/A 09/28/2018   Procedure: ESOPHAGOGASTRODUODENOSCOPY (EGD) WITH PROPOFOL;  Surgeon: Toney Reil, MD;  Location: [facility] ENDOSCOPY;  Service: Gastroenterology;  Laterality: N/A;  . EXCISIONAL HEMORRHOIDECTOMY  09/28/2006  . LAPAROSCOPIC VAGINAL HYSTERECTOMY WITH SALPINGECTOMY Bilateral 11/18/2016   Procedure: ATTEMPTED LAPAROSCOPIC ASSISTED VAGINAL HYSTERECTOMY WITH SALPINGECTOMY: LYSIS OF ADHESIONS ABDOMINAL HYSTERECTOMY Elsie Saas SALPIGECTOMY;  Surgeon: Richarda Overlie, MD;  Location: [facility];  Service: Gynecology;  Laterality: Bilateral;  . LUMBAR LAMINECTOMY  12/05/2008   L4 -- L5  . MICROTUBOPLASTY  2009   tubal ligation reversal  . ROBOTIC ASSISTED LAPAROSCOPIC CHOLECYSTECTOMY-MULTI SITE N/A 10/22/2018   Procedure: ROBOTIC ASSISTED LAPAROSCOPIC CHOLECYSTECTOMY-MULTI SITE;  Surgeon: Leafy Ro, MD;  Location: [facility] ORS;  Service: General;  Laterality: N/A;  . TUBAL LIGATION Bilateral 1998    Prior to Admission medications   Medication Sig Start Date End Date Taking? Authorizing Provider  albuterol (PROVENTIL HFA;VENTOLIN HFA) 108 (90 Base) MCG/ACT inhaler Inhale 1-2 puffs into the lungs every 6 (six) hours as needed for wheezing or shortness of breath.    [provider]  ALPRAZolam (XANAX) 1 MG tablet Take 1 mg by mouth at bedtime as needed for anxiety.    [provider]  benzonatate (TESSALON) 200 MG capsule TAKE 1 CAPSULE(200 MG) BY MOUTH TWICE DAILY AS NEEDED FOR COUGH 02/01/20   Scarboro, Coralee NorthAdam J, NP  chlorpheniramine-HYDROcodone (TUSSIONEX PENNKINETIC ER) 10-8 MG/5ML SUER Take 5 mLs by  mouth every 12 (twelve) hours as needed for cough. 12/22/19   Scarboro, Coralee NorthAdam J, NP  COMBIVENT RESPIMAT 20-100 MCG/ACT AERS respimat INHALE 1 PUFF INTO THE LUNGS EVERY 6 HOURS AS NEEDED FOR WHEEZING OR SHORTNESS OF BREATH 06/17/20   Lyndon CodeKhan, Fozia M, MD  DIVIGEL 1 MG/GM GEL Apply 1 application topically daily. Apply to thigh 06/30/18   [provider]  Eszopiclone 3 MG TABS Take 3 mg by mouth at bedtime.  09/17/16   [provider]  fluticasone (FLONASE) 50 MCG/ACT nasal spray Place 1 spray into both nostrils as needed for allergies or rhinitis.    [provider]  hydrochlorothiazide (HYDRODIURIL) 12.5 MG tablet Take 12.5 mg by mouth daily. 08/06/18   [provider]  INTRAROSA 6.5 MG INST Place 1 application vaginally at bedtime.  06/21/18   [provider]  ipratropium-albuterol (DUONEB) 0.5-2.5 (3) MG/3ML SOLN Take 3 mLs by nebulization every 6 (six) hours as needed. 11/15/19   Yevonne PaxKhan, Saadat A, MD  levothyroxine (SYNTHROID, LEVOTHROID) 100 MCG tablet Take 100 mcg by mouth daily before breakfast.    [provider]  lidocaine (LIDODERM) 5 % Place 1 patch onto the skin daily as needed (pain).  09/03/18   [provider]  lidocaine (XYLOCAINE) 5 % ointment Apply 1 application topically daily as needed (pain).  09/23/18   [provider]  metoprolol succinate (TOPROL-XL) 25 MG 24 hr tablet Take 12.5 mg by mouth every morning.     [provider]  omeprazole (PRILOSEC) 40 MG capsule Take 1 capsule (40 mg total) by mouth daily. 12/08/19 01/07/20  Miguel AschoffMonks, Sarah L., MD  predniSONE (DELTASONE) 10 MG tablet Take  1 tab po 3 times for 2 days,1 tab po twice a day for 2 days and 1 tab po daily for 2 days 11/21/19   Lyndon CodeKhan, Fozia M, MD  promethazine (PHENERGAN) 12.5 MG suppository Place 12.5 mg rectally every 6 (six) hours as needed for nausea or vomiting.  09/23/18   [provider]  traMADol (ULTRAM) 50 MG tablet Take 50 mg by mouth every 6  (six) hours as needed for moderate pain or severe pain.  07/01/18   [provider]    Allergies Other, Ibuprofen, Penicillins, and Adhesive [tape]  Family History  Adopted: Yes  Problem Relation Age of Onset  . Breast cancer Neg Hx     Social History Social History   Tobacco Use  . Smoking status: Never Smoker  . Smokeless tobacco: Never Used  Vaping Use  . Vaping Use: Never used  Substance Use Topics  . Alcohol use: Not Currently  . Drug use: No    Review of Systems  Constitutional: Negative for fever. Eyes: Negative for visual changes. ENT: Negative for sore throat. Neck: No neck pain  Cardiovascular: + chest pain. Respiratory: Negative for shortness of breath. Gastrointestinal: Negative for abdominal pain, vomiting or diarrhea. Genitourinary: Negative for dysuria. Musculoskeletal: Negative for back pain. Skin: Negative for rash. Neurological: Negative for headaches, weakness or numbness. Psych: No SI or HI  ____________________________________________   PHYSICAL EXAM:  VITAL SIGNS: ED Triage Vitals  Enc Vitals Group  BP 06/23/20 1850 (!) 181/94     Pulse Rate 06/23/20 1850 66     Resp 06/23/20 1850 20     Temp 06/23/20 1850 98.4 F (36.9 C)     Temp Source 06/23/20 1850 Oral     SpO2 06/23/20 1850 100 %     Weight 06/23/20 1852 150 lb (68 kg)     Height 06/23/20 1852  (1.6 m)     Head Circumference --      Peak Flow --      Pain Score 06/23/20 1852 8     Pain Loc --      Pain Edu? --      Excl. in GC? --     Constitutional: Alert and oriented. Well appearing and in no apparent distress. HEENT:      Head: Normocephalic and atraumatic.         Eyes: Conjunctivae are normal. Sclera is non-icteric.       Mouth/Throat: Mucous membranes are moist.       Neck: Supple with no signs of meningismus. Cardiovascular: Regular rate and rhythm. No murmurs, gallops, or rubs. 2+ symmetrical distal pulses are present in all extremities. No  JVD. Respiratory: Normal respiratory effort. Lungs are clear to auscultation bilaterally.  Gastrointestinal: Soft, non tender. Musculoskeletal:  No edema, cyanosis, or erythema of extremities. Neurologic: Normal speech and language. Face is symmetric. Moving all extremities. No gross focal neurologic deficits are appreciated. Skin: Skin is warm, dry and intact. No rash noted. Psychiatric: Mood and affect are normal. Speech and behavior are normal.  ____________________________________________   LABS (all labs ordered are listed, but only abnormal results are displayed)  Labs Reviewed  BASIC METABOLIC PANEL - Abnormal; Notable for the following components:      Result Value   Potassium 3.3 (*)    Glucose, Bld 100 (*)    All other components within normal limits  CBC  TROPONIN I (HIGH SENSITIVITY)  TROPONIN I (HIGH SENSITIVITY)   ____________________________________________  EKG  ED ECG REPORT I, Nita Sickle, the attending physician, personally viewed and interpreted this ECG.  Normal sinus rhythm, rate of 65, normal intervals, normal axis, no ST elevations or depressions.  Unchanged from prior. ____________________________________________  RADIOLOGY  I have personally reviewed the images performed during this visit and I agree with the Radiologist's read.   Interpretation by Radiologist:  DG Chest 2 View  Result Date: 06/23/2020 CLINICAL DATA:  Chest pain. EXAM: CHEST - 2 VIEW COMPARISON:  December 08, 2019 FINDINGS: There is no evidence of acute infiltrate, pleural effusion or pneumothorax. The heart size and mediastinal contours are within normal limits. The visualized skeletal structures are unremarkable. IMPRESSION: No active cardiopulmonary disease. Electronically Signed   By: Aram Candela M.D.   On: 06/23/2020 19:37   CT Angio Chest PE W and/or Wo Contrast  Result Date: 06/24/2020 CLINICAL DATA:  Chest pain. EXAM: CT ANGIOGRAPHY CHEST WITH CONTRAST  TECHNIQUE: Multidetector CT imaging of the chest was performed using the standard protocol during bolus administration of intravenous contrast. Multiplanar CT image reconstructions and MIPs were obtained to evaluate the vascular anatomy. CONTRAST:  75mL OMNIPAQUE IOHEXOL 350 MG/ML SOLN COMPARISON:  December 08, 2019 FINDINGS: Cardiovascular: Satisfactory opacification of the pulmonary arteries to the segmental level. No evidence of pulmonary embolism. Normal heart size. No pericardial effusion. Mediastinum/Nodes: No enlarged mediastinal, hilar, or axillary lymph nodes. Thyroid gland, trachea, and esophagus demonstrate no significant findings. Lungs/Pleura: Lungs are clear. No pleural effusion or pneumothorax.  Upper Abdomen: Subcentimeter cyst versus hemangioma is seen within the left lobe of the liver. Musculoskeletal: Bilateral breast implants are seen. No acute or significant osseous findings. Review of the MIP images confirms the above findings. IMPRESSION: 1. No CT evidence of pulmonary embolism. 2. No acute or active cardiopulmonary disease. 3. Subcentimeter cyst versus hemangioma within the left lobe of the liver. Electronically Signed   By: Aram Candela M.D.   On: 06/24/2020 03:29     ____________________________________________   PROCEDURES  Procedure(s) performed:yes .1-3 Lead EKG Interpretation Performed by: Nita Sickle, MD Authorized by: Nita Sickle, MD     Interpretation: normal     ECG rate assessment: normal     Rhythm: sinus rhythm     Ectopy: none     Critical Care performed: yes  CRITICAL CARE Performed by: Nita Sickle  ?  Total critical care time: 35 min  Critical care time was exclusive of separately billable procedures and treating other patients.  Critical care was necessary to treat or prevent imminent or life-threatening deterioration.  Critical care was time spent personally by me on the following activities: development of treatment  plan with patient and/or surrogate as well as nursing, discussions with consultants, evaluation of patient's response to treatment, examination of patient, obtaining history from patient or surrogate, ordering and performing treatments and interventions, ordering and review of laboratory studies, ordering and review of radiographic studies, pulse oximetry and re-evaluation of patient's condition.  ____________________________________________   INITIAL IMPRESSION / ASSESSMENT AND PLAN / ED COURSE   49 y.o. female the history of asthma, hypothyroidism, pericarditis after Covid infection who presents for evaluation of pleuritic chest and upper back pain.    Ddx pericarditis, myocarditis, pericardial effusion, PNA, PE, ACS  Patient is well-appearing in no distress.  She has elevated blood pressure but intact pulses in all 4 extremities and no neurological deficits.  She has no murmur on auscultation and lungs are clear.  She is euvolemic.  She has no asymmetric swelling of her extremities.  EKG with no evidence of dysrhythmias or ischemia.  HS trop x 2 negative, no leukocytosis, no significant electrolyte derangements.  Chest x-ray no acute findings.  CT angiogram done to rule out PE or pericardial effusion and is pending.  Will give IV toradol for possible pericarditis. Patient is already on colchicine.  Patient placed on telemetry for close monitoring.  Old medical records reviewed  _________________________ 3:39 AM on 06/24/2020 -----------------------------------------  CT reviewed with no evidence of PE or pericardial effusion, confirmed by radiology.  Pain improved after IV Toradol.  Recommended continuing colchicine and adding 600 mg of ibuprofen 3 times a day with meals.  Discussed close follow-up with her cardiologist.  Recommended return to the emergency room for fever, worsening chest pain or shortness of breath.  Results, home care, and follow-up discussed with patient and her husband  who is at bedside.     _____________________________________________ Please note:  Patient was evaluated in Emergency Department today for the symptoms described in the history of present illness. Patient was evaluated in the context of the global COVID-19 pandemic, which necessitated consideration that the patient might be at risk for infection with the SARS-CoV-2 virus that causes COVID-19. Institutional protocols and algorithms that pertain to the evaluation of patients at risk for COVID-19 are in a state of rapid change based on information released by regulatory bodies including the CDC and federal and state organizations. These policies and algorithms were followed during the patient's care in the  ED.  Some ED evaluations and interventions may be delayed as a result of limited staffing during the pandemic.   [place] Controlled Substance Database was reviewed by me. ____________________________________________   FINAL CLINICAL IMPRESSION(S) / ED DIAGNOSES   Final diagnoses:  Viral pericarditis, unspecified chronicity      NEW MEDICATIONS STARTED DURING THIS VISIT:  ED Discharge Orders    None       Note:  This document was prepared using Dragon voice recognition software and may include unintentional dictation errors.    Don Perking, [place], MD 06/24/20 [id number]

## 2020-06-24 NOTE — Discharge Instructions (Signed)
Continue to take colchicine as prescribed by Dr. Darrold Junker. Take 600mg  of ibuprofen 3 times a day with your meals. Return to the ER for worsening pain, shortness of breath, or fever.

## 2020-06-26 ENCOUNTER — Ambulatory Visit: Payer: BC Managed Care – PPO | Admitting: Internal Medicine

## 2020-06-26 ENCOUNTER — Other Ambulatory Visit: Payer: Self-pay

## 2020-06-26 ENCOUNTER — Encounter: Payer: Self-pay | Admitting: Internal Medicine

## 2020-06-26 VITALS — BP 118/80 | HR 71 | Temp 97.4°F | Wt 148.6 lb

## 2020-06-26 DIAGNOSIS — R05 Cough: Secondary | ICD-10-CM | POA: Diagnosis not present

## 2020-06-26 DIAGNOSIS — U071 COVID-19: Secondary | ICD-10-CM | POA: Diagnosis not present

## 2020-06-26 DIAGNOSIS — J449 Chronic obstructive pulmonary disease, unspecified: Secondary | ICD-10-CM

## 2020-06-26 DIAGNOSIS — R0789 Other chest pain: Secondary | ICD-10-CM | POA: Diagnosis not present

## 2020-06-26 DIAGNOSIS — R059 Cough, unspecified: Secondary | ICD-10-CM

## 2020-06-26 DIAGNOSIS — J301 Allergic rhinitis due to pollen: Secondary | ICD-10-CM

## 2020-06-26 MED ORDER — PREDNISONE 10 MG (21) PO TBPK: ORAL_TABLET | ORAL | 1 refills | Status: DC

## 2020-06-26 MED ORDER — TRELEGY ELLIPTA 100-62.5-25 MCG/INH IN AEPB: 1.0000 | INHALATION_SPRAY | Freq: Every day | RESPIRATORY_TRACT | 4 refills | Status: DC

## 2020-06-26 NOTE — Patient Instructions (Signed)
Fluticasone; Umeclidinium; Vilanterol inhalation powder What is this medicine? FLUTICASONE; UMECLIDINIUM; VILANTEROL (floo TIK a sone; ue MEK li DIN ee um; vye LAN ter ol) inhalation is a combination of 3 drugs to treat COPD and asthma. Umeclidinium and Vilanterol are bronchodilators that help keep airways open. Fluticasone decreases inflammation in the lungs. Do not use this drug combination for acute asthma attacks or bronchospasm. This medicine may be used for other purposes; ask your health care provider or pharmacist if you have questions. COMMON BRAND NAME(S): TRELEGY ELLIPTA What should I tell my health care provider before I take this medicine? They need to know if you have any of these conditions:  bone problems  diabetes  eye disease, vision problems  heart disease  high blood pressure  history of irregular heartbeat  immune system problems  infection  kidney disease  pheochromocytoma  prostate disease  seizures  thyroid disease  trouble passing urine  an unusual or allergic reaction to fluticasone, umeclidinium, vilanterol, lactose, milk proteins, other medicines, foods, dyes, or preservatives  pregnant or trying to get pregnant  breast-feeding How should I use this medicine? This drug is inhaled through the mouth. Rinse your mouth with water after use. Make sure not to swallow the water. Take it as directed on the prescription label at the same time every day. Do not use it more often than directed. A special MedGuide will be given to you by the pharmacist with each prescription and refill. Be sure to read this information carefully each time. Talk to your pediatrician about the use of this drug in children. Special care may be needed. Overdosage: If you think you have taken too much of this medicine contact a poison control center or emergency room at once. NOTE: This medicine is only for you. Do not share this medicine with others. What if I miss a  dose? If you miss a dose, take it as soon as you can. If it is almost time for your next dose, take only that dose. Do not take double or extra doses. What may interact with this medicine? Do not take this medicine with any of the following medications:  cisapride  dofetilide  dronedarone  MAOIs like Carbex, Eldepryl, Marplan, Nardil, and Parnate  pimozide  thioridazine  ziprasidone This medicine may also interact with the following medications:  aclidinium  antihistamines for allergy  antiviral medicines for HIV or AIDS  atropine  beta-blockers like metoprolol and propranolol  certain antibiotics like clarithromycin and telithromycin  certain medicines for bladder problems like oxybutynin, tolterodine  certain medicines for depression, anxiety, or psychotic disturbances  certain medicines for fungal infections like ketoconazole, itraconazole, posaconazole, voriconazole  certain medicines for Parkinson's disease like benztropine, trihexyphenidyl  certain medicines for stomach problems like dicyclomine, hyoscyamine  certain medicines for travel sickness like scopolamine  conivaptan  diuretics  ipratropium  medicines for colds  other medicines for breathing problems  other medicines that prolong the QT interval (cause an abnormal heart rhythm)  nefazodone  tiotropium This list may not describe all possible interactions. Give your health care provider a list of all the medicines, herbs, non-prescription drugs, or dietary supplements you use. Also tell them if you smoke, drink alcohol, or use illegal drugs. Some items may interact with your medicine. What should I watch for while using this medicine? Visit your doctor or health care professional for regular checkups. Tell your doctor or health care professional if your symptoms do not get better. Do not use this medicine   more than once every 24 hours. NEVER use this medicine for an acute asthma or COPD  attack. You should use your short-acting rescue inhalers for this purpose. If your symptoms get worse or if you need your short-acting inhalers more often, call your doctor right away. If you are going to have surgery tell your doctor or health care professional that you are using this medicine. Try not to come in contact with people with the chicken pox or measles. If you do, call your doctor. This medicine may increase blood sugar. Ask your healthcare provider if changes in diet or medicines are needed if you have diabetes. What side effects may I notice from receiving this medicine? Side effects that you should report to your doctor or health care professional as soon as possible:  allergic reactions like skin rash or hives, swelling of the face, lips, or tongue  breathing problems right after inhaling your medicine  chest pain  eye pain  fast, irregular heartbeat  feeling faint or lightheaded, falls  fever or chills  nausea, vomiting  signs and symptoms of high blood sugar such as being more thirsty or hungry or having to urinate more than normal. You may also feel very tired or have blurry vision.  trouble passing urine Side effects that usually do not require medical attention (report these to your doctor or health care professional if they continue or are bothersome):  back pain  changes in taste  cough  diarrhea  headache  nervousness  sore throat  tremor This list may not describe all possible side effects. Call your doctor for medical advice about side effects. You may report side effects to FDA at 1-800-FDA-1088. Where should I keep my medicine? Keep out of the reach of children and pets. Store at room temperature between 20 and 25 degrees C (68 and 77 degrees F). Keep inhaler away from extreme heat, cold or humidity. Throw away 6 weeks after removing it from the foil pouch, when the dose counter reads "0" or after the expiration date, whichever is  first. NOTE: This sheet is a summary. It may not cover all possible information. If you have questions about this medicine, talk to your doctor, pharmacist, or health care provider.  2020 Elsevier/Gold Standard (2019-10-10 12:45:04)  

## 2020-06-26 NOTE — Progress Notes (Signed)
[facility] [address] [place], [place] [address]  Pulmonary Sleep Medicine   Office Visit Note  Patient Name: Vanessa Chapman DOB: Sep 22, 1971 MRN [id number]  Date of Service: 06/26/2020  Complaints/HPI: SOB the patient had pulmonary functions done which showed some obstructive lung disease.  She has been having on and off from shortness of breath.  I spoke to her extensively about the results of the PFTs and explained to her that she does definitely have a reason for her breathing difficulties.  The patient has noted cough.  She does have noted wheezing.  This could very well be as a result of her COVID-19 and so therefore she has been having slow improvement note from her symptoms.  Currently she is not on any inhalers.  I did recommend that she go on inhaler so I gave her a prescription for trilogy I think this will probably help her significantly with the obstructive airways disease.  For the short-term I recommended placing her on some prednisone today  ROS  General: (-) fever, (-) chills, (-) night sweats, (-) weakness Skin: (-) rashes, (-) itching,. Eyes: (-) visual changes, (-) redness, (-) itching. Nose and Sinuses: (-) nasal stuffiness or itchiness, (-) postnasal drip, (-) nosebleeds, (-) sinus trouble. Mouth and Throat: (-) sore throat, (-) hoarseness. Neck: (-) swollen glands, (-) enlarged thyroid, (-) neck pain. Respiratory: + cough, (-) bloody sputum, + shortness of breath, + wheezing. Cardiovascular: - ankle swelling, (-) chest pain. Lymphatic: (-) lymph node enlargement. Neurologic: (-) numbness, (-) tingling. Psychiatric: (-) anxiety, (-) depression   Current Medication: Outpatient Encounter Medications as of 06/26/2020  Medication Sig  . albuterol (PROVENTIL HFA;VENTOLIN HFA) 108 (90 Base) MCG/ACT inhaler Inhale 1-2 puffs into the lungs every 6 (six) hours as needed for wheezing or shortness of breath.  . ALPRAZolam (XANAX) 1 MG tablet Take 1  mg by mouth at bedtime as needed for anxiety.  . benzonatate (TESSALON) 200 MG capsule TAKE 1 CAPSULE(200 MG) BY MOUTH TWICE DAILY AS NEEDED FOR COUGH  . chlorpheniramine-HYDROcodone (TUSSIONEX PENNKINETIC ER) 10-8 MG/5ML SUER Take 5 mLs by mouth every 12 (twelve) hours as needed for cough.  . COMBIVENT RESPIMAT 20-100 MCG/ACT AERS respimat INHALE 1 PUFF INTO THE LUNGS EVERY 6 HOURS AS NEEDED FOR WHEEZING OR SHORTNESS OF BREATH  . DIVIGEL 1 MG/GM GEL Apply 1 application topically daily. Apply to thigh  . Eszopiclone 3 MG TABS Take 3 mg by mouth at bedtime.   . fluticasone (FLONASE) 50 MCG/ACT nasal spray Place 1 spray into both nostrils as needed for allergies or rhinitis.  . hydrochlorothiazide (HYDRODIURIL) 12.5 MG tablet Take 12.5 mg by mouth daily.  Andreas Newport 6.5 MG INST Place 1 application vaginally at bedtime.   Marland Kitchen ipratropium-albuterol (DUONEB) 0.5-2.5 (3) MG/3ML SOLN Take 3 mLs by nebulization every 6 (six) hours as needed.  Marland Kitchen levothyroxine (SYNTHROID, LEVOTHROID) 100 MCG tablet Take 100 mcg by mouth daily before breakfast.  . metoprolol succinate (TOPROL-XL) 25 MG 24 hr tablet Take 12.5 mg by mouth as needed.   . traMADol (ULTRAM) 50 MG tablet Take 50 mg by mouth every 6 (six) hours as needed for moderate pain or severe pain.   Marland Kitchen lidocaine (LIDODERM) 5 % Place 1 patch onto the skin daily as needed (pain).   Marland Kitchen lidocaine (XYLOCAINE) 5 % ointment Apply 1 application topically daily as needed (pain).  (Patient not taking: Reported on 06/26/2020)  . omeprazole (PRILOSEC) 40 MG capsule Take 1 capsule (40 mg total)  by mouth daily.  . predniSONE (DELTASONE) 10 MG tablet Take  1 tab po 3 times for 2 days,1 tab po twice a day for 2 days and 1 tab po daily for 2 days (Patient not taking: Reported on 06/26/2020)  . promethazine (PHENERGAN) 12.5 MG suppository Place 12.5 mg rectally every 6 (six) hours as needed for nausea or vomiting.    No facility-administered encounter medications on file as of  06/26/2020.    Surgical History: Past Surgical History:  Procedure Laterality Date  . ABDOMINAL HYSTERECTOMY    . BACK SURGERY    . BALLOON SINUPLASTY  10/2015  . BREAST ENHANCEMENT SURGERY  2008  . BREAST SURGERY    . CESAREAN SECTION  1996  . ENDOMETRIAL ABLATION W/ NOVASURE  01/2015  . ESOPHAGOGASTRODUODENOSCOPY (EGD) WITH PROPOFOL N/A 09/28/2018   Procedure: ESOPHAGOGASTRODUODENOSCOPY (EGD) WITH PROPOFOL;  Surgeon: Toney Reil, MD;  Location: [facility] ENDOSCOPY;  Service: Gastroenterology;  Laterality: N/A;  . EXCISIONAL HEMORRHOIDECTOMY  09/28/2006  . LAPAROSCOPIC VAGINAL HYSTERECTOMY WITH SALPINGECTOMY Bilateral 11/18/2016   Procedure: ATTEMPTED LAPAROSCOPIC ASSISTED VAGINAL HYSTERECTOMY WITH SALPINGECTOMY: LYSIS OF ADHESIONS ABDOMINAL HYSTERECTOMY Elsie Saas SALPIGECTOMY;  Surgeon: Richarda Overlie, MD;  Location: [facility];  Service: Gynecology;  Laterality: Bilateral;  . LUMBAR LAMINECTOMY  12/05/2008   L4 -- L5  . MICROTUBOPLASTY  2009   tubal ligation reversal  . ROBOTIC ASSISTED LAPAROSCOPIC CHOLECYSTECTOMY-MULTI SITE N/A 10/22/2018   Procedure: ROBOTIC ASSISTED LAPAROSCOPIC CHOLECYSTECTOMY-MULTI SITE;  Surgeon: Leafy Ro, MD;  Location: [facility] ORS;  Service: General;  Laterality: N/A;  . TUBAL LIGATION Bilateral 1998    Medical History: Past Medical History:  Diagnosis Date  . Allergic asthma, mild intermittent, uncomplicated   . Depression   . GERD (gastroesophageal reflux disease)   . Heart murmur    asymptomatic  . Hypertension   . Hypothyroidism   . Hypothyroidism   . Pelvic pain   . PONV (postoperative nausea and vomiting)    years ago  . Wears contact lenses     Family History: Family History  Adopted: Yes  Problem Relation Age of Onset  . Breast cancer Neg Hx     Social History: Social History   Socioeconomic History  . Marital status: Married    Spouse name: Not on file  . Number of children: Not on file  . Years of  education: Not on file  . Highest education level: Not on file  Occupational History  . Not on file  Tobacco Use  . Smoking status: Never Smoker  . Smokeless tobacco: Never Used  Vaping Use  . Vaping Use: Never used  Substance and Sexual Activity  . Alcohol use: Not Currently  . Drug use: No  . Sexual activity: Not on file  Other Topics Concern  . Not on file  Social History Narrative  . Not on file   Social Determinants of Health   Financial Resource Strain:   . Difficulty of Paying Living Expenses:   Food Insecurity:   . Worried About Programme researcher, broadcasting/film/video in the Last Year:   . Barista in the Last Year:   Transportation Needs:   . Freight forwarder (Medical):   Marland Kitchen Lack of Transportation (Non-Medical):   Physical Activity:   . Days of Exercise per Week:   . Minutes of Exercise per Session:   Stress:   . Feeling of Stress :   Social Connections:   . Frequency of Communication with Friends and Family:   .  Frequency of Social Gatherings with Friends and Family:   . Attends Religious Services:   . Active Member of Clubs or Organizations:   . Attends Banker Meetings:   Marland Kitchen Marital Status:   Intimate Partner Violence:   . Fear of Current or Ex-Partner:   . Emotionally Abused:   Marland Kitchen Physically Abused:   . Sexually Abused:     Vital Signs: Blood pressure 118/80, pulse 71, temperature (!) 97.4 F (36.3 C), weight 148 lb 9.6 oz (67.4 kg), last menstrual period 09/19/2013, SpO2 98 %.  Examination: General Appearance: The patient is well-developed, well-nourished, and in no distress. Skin: Gross inspection of skin unremarkable. Head: normocephalic, no gross deformities. Eyes: no gross deformities noted. ENT: ears appear grossly normal no exudates. Neck: Supple. No thyromegaly. No LAD. Respiratory: few rhonchi noted at this time. Cardiovascular: Normal S1 and S2 without murmur or rub. Extremities: No cyanosis. pulses are equal. Neurologic: Alert  and oriented. No involuntary movements.  LABS: Recent Results (from the past 2160 hour(s))  Basic metabolic panel     Status: Abnormal   Collection Time: 06/23/20  6:55 PM  Result Value Ref Range   Sodium 141 135 - 145 mmol/L   Potassium 3.3 (L) 3.5 - 5.1 mmol/L   Chloride 102 98 - 111 mmol/L   CO2 30 22 - 32 mmol/L   Glucose, Bld 100 (H) 70 - 99 mg/dL    Comment: Glucose reference range applies only to samples taken after fasting for at least 8 hours.   BUN 9 6 - 20 mg/dL   Creatinine, Ser 1.60 0.44 - 1.00 mg/dL   Calcium 9.3 8.9 - 73.7 mg/dL   GFR calc non Af Amer >60 >60 mL/min   GFR calc Af Amer >60 >60 mL/min   Anion gap 9 5 - 15    Comment: Performed at [facility], [address] [place], [place] [address]  CBC     Status: None   Collection Time: 06/23/20  6:55 PM  Result Value Ref Range   WBC 5.8 4.0 - 10.5 K/uL   RBC 4.29 3.87 - 5.11 MIL/uL   Hemoglobin 13.3 12.0 - 15.0 g/dL   HCT 94.8 36 - 46 %   MCV 93.0 80.0 - 100.0 fL   MCH 31.0 26.0 - 34.0 pg   MCHC 33.3 30.0 - 36.0 g/dL   RDW 54.6 27.0 - 35.0 %   Platelets 246 150 - 400 K/uL   nRBC 0.0 0.0 - 0.2 %    Comment: Performed at [facility], [address], [place], [place] [address]  Troponin I (High Sensitivity)     Status: None   Collection Time: 06/23/20  6:55 PM  Result Value Ref Range   Troponin I (High Sensitivity) 5 <18 ng/L    Comment: (NOTE) Elevated high sensitivity troponin I (hsTnI) values and significant  changes across serial measurements may suggest ACS but many other  chronic and acute conditions are known to elevate hsTnI results.  Refer to the "Links" section for chest pain algorithms and additional  guidance. Performed at [facility], [address] [place], [place] [address]   Troponin I (High Sensitivity)     Status: None   Collection Time: 06/24/20 12:36 AM  Result Value Ref Range   Troponin I (High Sensitivity) 5 <18 ng/L    Comment: (NOTE) Elevated  high sensitivity troponin I (hsTnI) values and significant  changes across serial measurements may suggest ACS but many other  chronic and acute conditions are known to elevate hsTnI results.  Refer to the "Links" section for chest pain algorithms and additional  guidance. Performed at [facility], [address] [place], [place] [id number]     Radiology: CT Angio Chest PE W and/or Wo Contrast  Result Date: 06/24/2020 CLINICAL DATA:  Chest pain. EXAM: CT ANGIOGRAPHY CHEST WITH CONTRAST TECHNIQUE: Multidetector CT imaging of the chest was performed using the standard protocol during bolus administration of intravenous contrast. Multiplanar CT image reconstructions and MIPs were obtained to evaluate the vascular anatomy. CONTRAST:  75mL OMNIPAQUE IOHEXOL 350 MG/ML SOLN COMPARISON:  December 08, 2019 FINDINGS: Cardiovascular: Satisfactory opacification of the pulmonary arteries to the segmental level. No evidence of pulmonary embolism. Normal heart size. No pericardial effusion. Mediastinum/Nodes: No enlarged mediastinal, hilar, or axillary lymph nodes. Thyroid gland, trachea, and esophagus demonstrate no significant findings. Lungs/Pleura: Lungs are clear. No pleural effusion or pneumothorax. Upper Abdomen: Subcentimeter cyst versus hemangioma is seen within the left lobe of the liver. Musculoskeletal: Bilateral breast implants are seen. No acute or significant osseous findings. Review of the MIP images confirms the above findings. IMPRESSION: 1. No CT evidence of pulmonary embolism. 2. No acute or active cardiopulmonary disease. 3. Subcentimeter cyst versus hemangioma within the left lobe of the liver. Electronically Signed   By: Aram Candelahaddeus  Houston M.D.   On: 06/24/2020 03:29    DG Chest 2 View  Result Date: 06/23/2020 CLINICAL DATA:  Chest pain. EXAM: CHEST - 2 VIEW COMPARISON:  December 08, 2019 FINDINGS: There is no evidence of acute infiltrate, pleural effusion or pneumothorax. The  heart size and mediastinal contours are within normal limits. The visualized skeletal structures are unremarkable. IMPRESSION: No active cardiopulmonary disease. Electronically Signed   By: Aram Candelahaddeus  Houston M.D.   On: 06/23/2020 19:37   CT Angio Chest PE W and/or Wo Contrast  Result Date: 06/24/2020 CLINICAL DATA:  Chest pain. EXAM: CT ANGIOGRAPHY CHEST WITH CONTRAST TECHNIQUE: Multidetector CT imaging of the chest was performed using the standard protocol during bolus administration of intravenous contrast. Multiplanar CT image reconstructions and MIPs were obtained to evaluate the vascular anatomy. CONTRAST:  75mL OMNIPAQUE IOHEXOL 350 MG/ML SOLN COMPARISON:  December 08, 2019 FINDINGS: Cardiovascular: Satisfactory opacification of the pulmonary arteries to the segmental level. No evidence of pulmonary embolism. Normal heart size. No pericardial effusion. Mediastinum/Nodes: No enlarged mediastinal, hilar, or axillary lymph nodes. Thyroid gland, trachea, and esophagus demonstrate no significant findings. Lungs/Pleura: Lungs are clear. No pleural effusion or pneumothorax. Upper Abdomen: Subcentimeter cyst versus hemangioma is seen within the left lobe of the liver. Musculoskeletal: Bilateral breast implants are seen. No acute or significant osseous findings. Review of the MIP images confirms the above findings. IMPRESSION: 1. No CT evidence of pulmonary embolism. 2. No acute or active cardiopulmonary disease. 3. Subcentimeter cyst versus hemangioma within the left lobe of the liver. Electronically Signed   By: Aram Candelahaddeus  Houston M.D.   On: 06/24/2020 03:29    DG Chest 2 View  Result Date: 06/23/2020 CLINICAL DATA:  Chest pain. EXAM: CHEST - 2 VIEW COMPARISON:  December 08, 2019 FINDINGS: There is no evidence of acute infiltrate, pleural effusion or pneumothorax. The heart size and mediastinal contours are within normal limits. The visualized skeletal structures are unremarkable. IMPRESSION: No active  cardiopulmonary disease. Electronically Signed   By: Aram Candelahaddeus  Houston M.D.   On: 06/23/2020 19:37   CT Angio Chest PE W and/or Wo Contrast  Result Date: 06/24/2020 CLINICAL DATA:  Chest pain.  EXAM: CT ANGIOGRAPHY CHEST WITH CONTRAST TECHNIQUE: Multidetector CT imaging of the chest was performed using the standard protocol during bolus administration of intravenous contrast. Multiplanar CT image reconstructions and MIPs were obtained to evaluate the vascular anatomy. CONTRAST:  75mL OMNIPAQUE IOHEXOL 350 MG/ML SOLN COMPARISON:  December 08, 2019 FINDINGS: Cardiovascular: Satisfactory opacification of the pulmonary arteries to the segmental level. No evidence of pulmonary embolism. Normal heart size. No pericardial effusion. Mediastinum/Nodes: No enlarged mediastinal, hilar, or axillary lymph nodes. Thyroid gland, trachea, and esophagus demonstrate no significant findings. Lungs/Pleura: Lungs are clear. No pleural effusion or pneumothorax. Upper Abdomen: Subcentimeter cyst versus hemangioma is seen within the left lobe of the liver. Musculoskeletal: Bilateral breast implants are seen. No acute or significant osseous findings. Review of the MIP images confirms the above findings. IMPRESSION: 1. No CT evidence of pulmonary embolism. 2. No acute or active cardiopulmonary disease. 3. Subcentimeter cyst versus hemangioma within the left lobe of the liver. Electronically Signed   By: Aram Candela M.D.   On: 06/24/2020 03:29      Assessment and Plan: Patient Active Problem List   Diagnosis Date Noted  . Biliary dyskinesia   . Mild intermittent asthma without complication 04/06/2018  . Seasonal allergic rhinitis due to pollen 04/06/2018  . Chronic pansinusitis 04/06/2018  . Chronic pelvic pain in female 01/08/2018  . Chronic pain syndrome 07/23/2017  . Left lower quadrant pain 05/27/2017  . Pelvic pain 11/18/2016    1. Chronic obstructive lung disease probably related to post Covid infection.  She  does also have a history of allergies and has history of allergic rhinitis secondary to pollens.  I think she will benefit from placement on trilogy.  She should also use her rescue inhaler as needed. 2. Cough and tightness in the chest again related to underlying pulmonary disease again related secondary to Covid disease. 3. COVID-19 virus infection along recovered however she says have some residual changes related to the COVID-19 will need to continue to follow this closely. 4. Seasonal allergic rhinitis if there is no improvement would consider doing allergy testing. 5. Mild intermittent asthma slow to improve because of the COVID-19 infection that she has had  General Counseling: I have discussed the findings of the evaluation and examination with Clydie Braun.  I have also discussed any further diagnostic evaluation thatmay be needed or ordered today. Hera verbalizes understanding of the findings of todays visit. We also reviewed her medications today and discussed drug interactions and side effects including but not limited excessive drowsiness and altered mental states. We also discussed that there is always a risk not just to her but also people around her. she has been encouraged to call the office with any questions or concerns that should arise related to todays visit.  No orders of the defined types were placed in this encounter.    Time spent: 35 minutes  I have personally obtained a history, examined the patient, evaluated laboratory and imaging results, formulated the assessment and plan and placed orders.    Yevonne Pax, MD [facility] Pulmonary and Critical Care Sleep medicine

## 2020-06-28 ENCOUNTER — Encounter: Payer: Self-pay | Admitting: Internal Medicine

## 2020-06-29 ENCOUNTER — Other Ambulatory Visit: Payer: BC Managed Care – PPO

## 2020-07-09 ENCOUNTER — Other Ambulatory Visit: Payer: Self-pay | Admitting: Adult Health

## 2020-07-09 DIAGNOSIS — R059 Cough, unspecified: Secondary | ICD-10-CM

## 2020-07-11 ENCOUNTER — Telehealth: Payer: Self-pay

## 2020-07-20 ENCOUNTER — Other Ambulatory Visit: Payer: Self-pay | Admitting: Internal Medicine

## 2020-08-02 ENCOUNTER — Encounter: Payer: Self-pay | Admitting: Internal Medicine

## 2020-08-02 ENCOUNTER — Ambulatory Visit: Payer: BC Managed Care – PPO | Admitting: Internal Medicine

## 2020-08-02 DIAGNOSIS — J449 Chronic obstructive pulmonary disease, unspecified: Secondary | ICD-10-CM

## 2020-08-02 DIAGNOSIS — J4489 Other specified chronic obstructive pulmonary disease: Secondary | ICD-10-CM

## 2020-08-02 DIAGNOSIS — R0600 Dyspnea, unspecified: Secondary | ICD-10-CM

## 2020-08-02 DIAGNOSIS — Z8616 Personal history of COVID-19: Secondary | ICD-10-CM

## 2020-08-02 MED ORDER — AZITHROMYCIN 250 MG PO TABS: ORAL_TABLET | ORAL | 0 refills | Status: DC

## 2020-08-02 MED ORDER — PREDNISONE 10 MG (21) PO TBPK: ORAL_TABLET | ORAL | 0 refills | Status: DC

## 2020-08-02 NOTE — Progress Notes (Addendum)
[facility] [address] [place], [place] [address]  Internal MEDICINE  Telephone Visit  Patient Name: Vanessa Chapman  [id number]  [id number]  Date of Service: 08/03/2020  I connected with the patient at 12:07 by telephone and verified the patients identity using two identifiers.   I discussed the limitations, risks, security and privacy concerns of performing an evaluation and management service by telephone and the availability of in person appointments. I also discussed with the patient that there may be a patient responsible charge related to the service.  The patient expressed understanding and agrees to proceed.    Chief Complaint  Patient presents with  . Telephone Assessment     video [phone]  . Telephone Screen    like to discuss trelegy and going  4 days   . Cough       . Nasal Congestion  . Abdominal Pain  . Headache  . Sinusitis    HPI Patient tested positive for COVID19 in November. Has been having issues with continued shortness of breath as well as cough Compelted 5th round of prednisone, feels much better while on prednisone Since being off of prednisone she is now having shortness of breath again as well as cough and extreme fatigue as well as body aches. Afebrile Has become frustrated with not feeling well for extended period of time since initial diagnosis in November   Current Medication: Outpatient Encounter Medications as of 08/02/2020  Medication Sig  . albuterol (PROVENTIL HFA;VENTOLIN HFA) 108 (90 Base) MCG/ACT inhaler Inhale 1-2 puffs into the lungs every 6 (six) hours as needed for wheezing or shortness of breath.  . ALPRAZolam (XANAX) 1 MG tablet Take 1 mg by mouth at bedtime as needed for anxiety.  . benzonatate (TESSALON) 200 MG capsule TAKE 1 CAPSULE(200 MG) BY MOUTH TWICE DAILY AS NEEDED FOR COUGH  . chlorpheniramine-HYDROcodone (TUSSIONEX PENNKINETIC ER) 10-8 MG/5ML SUER Take 5 mLs by mouth every 12 (twelve) hours as  needed for cough.  . COMBIVENT RESPIMAT 20-100 MCG/ACT AERS respimat INHALE 1 PUFF INTO THE LUNGS EVERY 6 HOURS AS NEEDED FOR WHEEZING OR SHORTNESS OF BREATH  . DIVIGEL 1 MG/GM GEL Apply 1 application topically daily. Apply to thigh  . Eszopiclone 3 MG TABS Take 3 mg by mouth at bedtime.   . fluticasone (FLONASE) 50 MCG/ACT nasal spray Place 1 spray into both nostrils as needed for allergies or rhinitis.  . Fluticasone-Umeclidin-Vilant (TRELEGY ELLIPTA) 100-62.5-25 MCG/INH AEPB Inhale 1 puff into the lungs daily.  . hydrochlorothiazide (HYDRODIURIL) 12.5 MG tablet Take 12.5 mg by mouth daily.  Andreas Newport. INTRAROSA 6.5 MG INST Place 1 application vaginally at bedtime.   Marland Kitchen. ipratropium-albuterol (DUONEB) 0.5-2.5 (3) MG/3ML SOLN Take 3 mLs by nebulization every 6 (six) hours as needed.  Marland Kitchen. levothyroxine (SYNTHROID, LEVOTHROID) 100 MCG tablet Take 100 mcg by mouth daily before breakfast.  . lidocaine (XYLOCAINE) 5 % ointment Apply 1 application topically daily as needed (pain).   . metoprolol succinate (TOPROL-XL) 25 MG 24 hr tablet Take 12.5 mg by mouth as needed.   . traMADol (ULTRAM) 50 MG tablet Take 50 mg by mouth every 6 (six) hours as needed for moderate pain or severe pain.   . [DISCONTINUED] predniSONE (STERAPRED UNI-PAK 21 TAB) 10 MG (21) TBPK tablet As directed  . azithromycin (ZITHROMAX Z-PAK) 250 MG tablet Take two 250 mg tablets on day one and one 250 mg tablet each day for the remainder.  . lidocaine (LIDODERM) 5 % Place 1 patch onto  the skin daily as needed (pain).   Marland Kitchen omeprazole (PRILOSEC) 40 MG capsule Take 1 capsule (40 mg total) by mouth daily.  . predniSONE (STERAPRED UNI-PAK 21 TAB) 10 MG (21) TBPK tablet Take as prescribed  . promethazine (PHENERGAN) 12.5 MG suppository Place 12.5 mg rectally every 6 (six) hours as needed for nausea or vomiting.    No facility-administered encounter medications on file as of 08/02/2020.    Surgical History: Past Surgical History:  Procedure Laterality  Date  . ABDOMINAL HYSTERECTOMY    . BACK SURGERY    . BALLOON SINUPLASTY  10/2015  . BREAST ENHANCEMENT SURGERY  2008  . BREAST SURGERY    . CESAREAN SECTION  1996  . ENDOMETRIAL ABLATION W/ NOVASURE  01/2015  . ESOPHAGOGASTRODUODENOSCOPY (EGD) WITH PROPOFOL N/A 09/28/2018   Procedure: ESOPHAGOGASTRODUODENOSCOPY (EGD) WITH PROPOFOL;  Surgeon: Toney Reil, MD;  Location: [facility] ENDOSCOPY;  Service: Gastroenterology;  Laterality: N/A;  . EXCISIONAL HEMORRHOIDECTOMY  09/28/2006  . LAPAROSCOPIC VAGINAL HYSTERECTOMY WITH SALPINGECTOMY Bilateral 11/18/2016   Procedure: ATTEMPTED LAPAROSCOPIC ASSISTED VAGINAL HYSTERECTOMY WITH SALPINGECTOMY: LYSIS OF ADHESIONS ABDOMINAL HYSTERECTOMY Elsie Saas SALPIGECTOMY;  Surgeon: Richarda Overlie, MD;  Location: [facility];  Service: Gynecology;  Laterality: Bilateral;  . LUMBAR LAMINECTOMY  12/05/2008   L4 -- L5  . MICROTUBOPLASTY  2009   tubal ligation reversal  . ROBOTIC ASSISTED LAPAROSCOPIC CHOLECYSTECTOMY-MULTI SITE N/A 10/22/2018   Procedure: ROBOTIC ASSISTED LAPAROSCOPIC CHOLECYSTECTOMY-MULTI SITE;  Surgeon: Leafy Ro, MD;  Location: [facility] ORS;  Service: General;  Laterality: N/A;  . TUBAL LIGATION Bilateral 1998    Medical History: Past Medical History:  Diagnosis Date  . Allergic asthma, mild intermittent, uncomplicated   . Depression   . GERD (gastroesophageal reflux disease)   . Heart murmur    asymptomatic  . Hypertension   . Hypothyroidism   . Hypothyroidism   . Pelvic pain   . PONV (postoperative nausea and vomiting)    years ago  . Wears contact lenses     Family History: Family History  Adopted: Yes  Problem Relation Age of Onset  . Breast cancer Neg Hx     Social History   Socioeconomic History  . Marital status: Married    Spouse name: Not on file  . Number of children: Not on file  . Years of education: Not on file  . Highest education level: Not on file  Occupational History  . Not on file   Tobacco Use  . Smoking status: Never Smoker  . Smokeless tobacco: Never Used  Vaping Use  . Vaping Use: Never used  Substance and Sexual Activity  . Alcohol use: Not Currently  . Drug use: No  . Sexual activity: Not on file  Other Topics Concern  . Not on file  Social History Narrative  . Not on file   Social Determinants of Health   Financial Resource Strain:   . Difficulty of Paying Living Expenses: Not on file  Food Insecurity:   . Worried About Programme researcher, broadcasting/film/video in the Last Year: Not on file  . Ran Out of Food in the Last Year: Not on file  Transportation Needs:   . Lack of Transportation (Medical): Not on file  . Lack of Transportation (Non-Medical): Not on file  Physical Activity:   . Days of Exercise per Week: Not on file  . Minutes of Exercise per Session: Not on file  Stress:   . Feeling of Stress : Not on file  Social Connections:   .  Frequency of Communication with Friends and Family: Not on file  . Frequency of Social Gatherings with Friends and Family: Not on file  . Attends Religious Services: Not on file  . Active Member of Clubs or Organizations: Not on file  . Attends Banker Meetings: Not on file  . Marital Status: Not on file  Intimate Partner Violence:   . Fear of Current or Ex-Partner: Not on file  . Emotionally Abused: Not on file  . Physically Abused: Not on file  . Sexually Abused: Not on file   Review of Systems  Constitutional: Positive for activity change and fatigue. Negative for diaphoresis and fever.  HENT: Positive for congestion and voice change. Negative for ear pain, postnasal drip, sinus pressure, sinus pain and trouble swallowing.   Eyes: Negative for photophobia, discharge, redness, itching and visual disturbance.  Respiratory: Positive for cough, chest tightness and shortness of breath. Negative for wheezing.   Cardiovascular: Negative for chest pain, palpitations and leg swelling.  Gastrointestinal: Negative for  abdominal pain, constipation, diarrhea, nausea and vomiting.  Genitourinary: Negative for dysuria and flank pain.  Musculoskeletal: Positive for arthralgias and myalgias. Negative for back pain, gait problem and neck pain.  Skin: Negative for color change.  Allergic/Immunologic: Negative for environmental allergies and food allergies.  Neurological: Negative for dizziness and headaches.  Hematological: Does not bruise/bleed easily.  Psychiatric/Behavioral: Positive for sleep disturbance. Negative for agitation, behavioral problems (depression) and hallucinations.    Vital Signs: BP (!) 156/90   Pulse (!) 56   Temp 98.1 F (36.7 C)   Ht 5\' 3"  (1.6 m)   Wt 153 lb (69.4 kg)   LMP 09/19/2013   SpO2 98%   BMI 27.10 kg/m    Observation/Objective: No acute distress noted. Congestion heard in voice, coughing heard.   Assessment/Plan: 1. Chronic obstructive asthma (HCC) Continues to have productive cough, shortness of breath, extreme fatigue and overall body aches. Has not completed course of antibiotics since November for symptoms. Will review CXR. - azithromycin (ZITHROMAX Z-PAK) 250 MG tablet; Take two 250 mg tablets on day one and one 250 mg tablet each day for the remainder.  Dispense: 6 each; Refill: 0 - predniSONE (STERAPRED UNI-PAK 21 TAB) 10 MG (21) TBPK tablet; Take as prescribed  Dispense: 21 tablet; Refill: 0 - DG Chest 2 View; Future  2. History of COVID-19 Symptoms have been recurrent since initial diagnosis of COVID-19 in November 2020. - azithromycin (ZITHROMAX Z-PAK) 250 MG tablet; Take two 250 mg tablets on day one and one 250 mg tablet each day for the remainder.  Dispense: 6 each; Refill: 0 - predniSONE (STERAPRED UNI-PAK 21 TAB) 10 MG (21) TBPK tablet; Take as prescribed  Dispense: 21 tablet; Refill: 0 - DG Chest 2 View; Future  General Counseling: Vanessa Chapman understanding of the findings of today's phone visit and agrees with plan of treatment. I have  discussed any further diagnostic evaluation that may be needed or ordered today. We also reviewed her medications today. she has been encouraged to call the office with any questions or concerns that should arise related to todays visit.    Orders Placed This Encounter  Procedures  . DG Chest 2 View    Meds ordered this encounter  Medications  . azithromycin (ZITHROMAX Z-PAK) 250 MG tablet    Sig: Take two 250 mg tablets on day one and one 250 mg tablet each day for the remainder.    Dispense:  6 each  Refill:  0  . predniSONE (STERAPRED UNI-PAK 21 TAB) 10 MG (21) TBPK tablet    Sig: Take as prescribed    Dispense:  21 tablet    Refill:  0     Time spent: 20 Minutes   Lubertha Basque. Brynnlee Cumpian AGNP-C Internal medicine

## 2020-08-03 ENCOUNTER — Encounter: Payer: Self-pay | Admitting: Internal Medicine

## 2020-08-13 ENCOUNTER — Telehealth: Payer: Self-pay

## 2020-08-13 NOTE — Telephone Encounter (Signed)
LMOM for office visit on 9/1 

## 2020-08-15 ENCOUNTER — Ambulatory Visit: Payer: BC Managed Care – PPO | Admitting: Internal Medicine

## 2020-08-15 ENCOUNTER — Encounter: Payer: Self-pay | Admitting: Internal Medicine

## 2020-08-15 VITALS — BP 151/88 | HR 62 | Temp 98.3°F | Resp 16 | Ht 63.0 in | Wt 150.0 lb

## 2020-08-15 DIAGNOSIS — J449 Chronic obstructive pulmonary disease, unspecified: Secondary | ICD-10-CM

## 2020-08-15 MED ORDER — PREDNISONE 10 MG PO TABS
10.0000 mg | ORAL_TABLET | Freq: Every day | ORAL | 0 refills | Status: DC
Start: 2020-08-15 — End: 2020-09-17

## 2020-08-15 MED ORDER — AZITHROMYCIN 250 MG PO TABS
ORAL_TABLET | ORAL | 0 refills | Status: DC
Start: 2020-08-15 — End: 2020-09-17

## 2020-08-15 NOTE — Progress Notes (Signed)
error 

## 2020-08-27 ENCOUNTER — Ambulatory Visit: Payer: BC Managed Care – PPO | Admitting: Internal Medicine

## 2020-09-17 ENCOUNTER — Ambulatory Visit: Payer: BC Managed Care – PPO | Admitting: Internal Medicine

## 2020-09-17 ENCOUNTER — Other Ambulatory Visit: Payer: Self-pay

## 2020-09-17 ENCOUNTER — Encounter: Payer: Self-pay | Admitting: Internal Medicine

## 2020-09-17 VITALS — BP 138/86 | HR 57 | Temp 97.4°F | Resp 16 | Ht 63.0 in | Wt 154.2 lb

## 2020-09-17 DIAGNOSIS — R0602 Shortness of breath: Secondary | ICD-10-CM | POA: Diagnosis not present

## 2020-09-17 DIAGNOSIS — J449 Chronic obstructive pulmonary disease, unspecified: Secondary | ICD-10-CM | POA: Diagnosis not present

## 2020-09-17 DIAGNOSIS — J4489 Other specified chronic obstructive pulmonary disease: Secondary | ICD-10-CM

## 2020-09-17 DIAGNOSIS — Z8616 Personal history of COVID-19: Secondary | ICD-10-CM

## 2020-09-17 NOTE — Progress Notes (Signed)
[facility] [address] [place], [place] [address]  Pulmonary Sleep Medicine   Office Visit Note  Patient Name: Vanessa Chapman DOB: [birth year] MRN [id number]  Date of Service: 09/17/2020  Complaints/HPI: Pt is here for pulmonary follow up.  She continues to deal with long haul symptoms of Covid-19.  At last visit she was given 3 weeks of steroids and antibiotics.  As soon as the medication is complete the symptoms return.  She reports chronic fatigue, back pain.    ROS  General: (-) fever, (-) chills, (-) night sweats, (-) weakness Skin: (-) rashes, (-) itching,. Eyes: (-) visual changes, (-) redness, (-) itching. Nose and Sinuses: (-) nasal stuffiness or itchiness, (-) postnasal drip, (-) nosebleeds, (-) sinus trouble. Mouth and Throat: (-) sore throat, (-) hoarseness. Neck: (-) swollen glands, (-) enlarged thyroid, (-) neck pain. Respiratory: - cough, (-) bloody sputum, - shortness of breath, - wheezing. Cardiovascular: - ankle swelling, (-) chest pain. Lymphatic: (-) lymph node enlargement. Neurologic: (-) numbness, (-) tingling. Psychiatric: (-) anxiety, (-) depression   Current Medication: Outpatient Encounter Medications as of 09/17/2020  Medication Sig  . albuterol (PROVENTIL HFA;VENTOLIN HFA) 108 (90 Base) MCG/ACT inhaler Inhale 1-2 puffs into the lungs every 6 (six) hours as needed for wheezing or shortness of breath.  . ALPRAZolam (XANAX) 1 MG tablet Take 1 mg by mouth at bedtime as needed for anxiety.  . benzonatate (TESSALON) 200 MG capsule TAKE 1 CAPSULE(200 MG) BY MOUTH TWICE DAILY AS NEEDED FOR COUGH  . COMBIVENT RESPIMAT 20-100 MCG/ACT AERS respimat INHALE 1 PUFF INTO THE LUNGS EVERY 6 HOURS AS NEEDED FOR WHEEZING OR SHORTNESS OF BREATH  . DIVIGEL 1 MG/GM GEL Apply 1 application topically daily. Apply to thigh  . Eszopiclone 3 MG TABS Take 3 mg by mouth at bedtime.   . fluticasone (FLONASE) 50 MCG/ACT nasal spray Place 1 spray into both  nostrils as needed for allergies or rhinitis.  . Fluticasone-Umeclidin-Vilant (TRELEGY ELLIPTA) 100-62.5-25 MCG/INH AEPB Inhale 1 puff into the lungs daily.  . hydrochlorothiazide (HYDRODIURIL) 12.5 MG tablet Take 12.5 mg by mouth daily.  Andreas Newport. INTRAROSA 6.5 MG INST Place 1 application vaginally at bedtime.   Marland Kitchen. ipratropium-albuterol (DUONEB) 0.5-2.5 (3) MG/3ML SOLN Take 3 mLs by nebulization every 6 (six) hours as needed.  Marland Kitchen. levothyroxine (SYNTHROID, LEVOTHROID) 100 MCG tablet Take 100 mcg by mouth daily before breakfast.  . metoprolol succinate (TOPROL-XL) 25 MG 24 hr tablet Take 12.5 mg by mouth as needed.   . traMADol (ULTRAM) 50 MG tablet Take 50 mg by mouth every 6 (six) hours as needed for moderate pain or severe pain.   . [DISCONTINUED] azithromycin (ZITHROMAX) 250 MG tablet Take one tab po qd for 21 days  . [DISCONTINUED] lidocaine (XYLOCAINE) 5 % ointment Apply 1 application topically daily as needed (pain).   . [DISCONTINUED] predniSONE (DELTASONE) 10 MG tablet Take 1 tablet (10 mg total) by mouth daily with breakfast.  . omeprazole (PRILOSEC) 40 MG capsule Take 1 capsule (40 mg total) by mouth daily.  . [DISCONTINUED] chlorpheniramine-HYDROcodone (TUSSIONEX PENNKINETIC ER) 10-8 MG/5ML SUER Take 5 mLs by mouth every 12 (twelve) hours as needed for cough. (Patient not taking: Reported on 08/15/2020)   No facility-administered encounter medications on file as of 09/17/2020.    Surgical History: Past Surgical History:  Procedure Laterality Date  . ABDOMINAL HYSTERECTOMY    . BACK SURGERY    . BALLOON SINUPLASTY  10/2015  . BREAST ENHANCEMENT SURGERY  2008  .  BREAST SURGERY    . CESAREAN SECTION  1996  . ENDOMETRIAL ABLATION W/ NOVASURE  01/2015  . ESOPHAGOGASTRODUODENOSCOPY (EGD) WITH PROPOFOL N/A 09/28/2018   Procedure: ESOPHAGOGASTRODUODENOSCOPY (EGD) WITH PROPOFOL;  Surgeon: Toney Reil, MD;  Location[facility] ENDOSCOPY;  Service: Gastroenterology;  Laterality: N/A;  . EXCISIONAL  HEMORRHOIDECTOMY  09/28/2006  . LAPAROSCOPIC VAGINAL HYSTERECTOMY WITH SALPINGECTOMY Bilateral 11/18/2016   Procedure: ATTEMPTED LAPAROSCOPIC ASSISTED VAGINAL HYSTERECTOMY WITH SALPINGECTOMY: LYSIS OF ADHESIONS ABDOMINAL HYSTERECTOMY Elsie Saas SALPIGECTOMY;  Surgeon: Richarda Overlie, MD;  Location: [facility] ;  Service: Gynecology;  Laterality: Bilateral;  . LUMBAR LAMINECTOMY  12/05/2008   L4 -- L5  . MICROTUBOPLASTY  2009   tubal ligation reversal  . ROBOTIC ASSISTED LAPAROSCOPIC CHOLECYSTECTOMY-MULTI SITE N/A 10/22/2018   Procedure: ROBOTIC ASSISTED LAPAROSCOPIC CHOLECYSTECTOMY-MULTI SITE;  Surgeon: Leafy Ro, MD;  Location: [facility] ORS;  Service: General;  Laterality: N/A;  . TUBAL LIGATION Bilateral 1998    Medical History: Past Medical History:  Diagnosis Date  . Allergic asthma, mild intermittent, uncomplicated   . Depression   . GERD (gastroesophageal reflux disease)   . Heart murmur    asymptomatic  . Hypertension   . Hypothyroidism   . Hypothyroidism   . Pelvic pain   . PONV (postoperative nausea and vomiting)    years ago  . Wears contact lenses     Family History: Family History  Adopted: Yes  Problem Relation Age of Onset  . Breast cancer Neg Hx     Social History: Social History   Socioeconomic History  . Marital status: Married    Spouse name: Not on file  . Number of children: Not on file  . Years of education: Not on file  . Highest education level: Not on file  Occupational History  . Not on file  Tobacco Use  . Smoking status: Never Smoker  . Smokeless tobacco: Never Used  Vaping Use  . Vaping Use: Never used  Substance and Sexual Activity  . Alcohol use: Not Currently  . Drug use: No  . Sexual activity: Not on file  Other Topics Concern  . Not on file  Social History Narrative  . Not on file   Social Determinants of Health   Financial Resource Strain:   . Difficulty of Paying Living Expenses: Not on file  Food Insecurity:    . Worried About Programme researcher, broadcasting/film/video in the Last Year: Not on file  . Ran Out of Food in the Last Year: Not on file  Transportation Needs:   . Lack of Transportation (Medical): Not on file  . Lack of Transportation (Non-Medical): Not on file  Physical Activity:   . Days of Exercise per Week: Not on file  . Minutes of Exercise per Session: Not on file  Stress:   . Feeling of Stress : Not on file  Social Connections:   . Frequency of Communication with Friends and Family: Not on file  . Frequency of Social Gatherings with Friends and Family: Not on file  . Attends Religious Services: Not on file  . Active Member of Clubs or Organizations: Not on file  . Attends Banker Meetings: Not on file  . Marital Status: Not on file  Intimate Partner Violence:   . Fear of Current or Ex-Partner: Not on file  . Emotionally Abused: Not on file  . Physically Abused: Not on file  . Sexually Abused: Not on file    Vital Signs: Blood pressure 138/86, pulse Marland Kitchen)  57, temperature (!) 97.4 F (36.3 C), resp. rate 16, height 5\' 3"  (1.6 m), weight 154 lb 3.2 oz (69.9 kg), last menstrual period 09/19/2013, SpO2 100 %.  Examination: General Appearance: The patient is well-developed, well-nourished, and in no distress. Skin: Gross inspection of skin unremarkable. Head: normocephalic, no gross deformities. Eyes: no gross deformities noted. ENT: ears appear grossly normal no exudates. Neck: Supple. No thyromegaly. No LAD. Respiratory: clear bilaterally. Cardiovascular: Normal S1 and S2 without murmur or rub. Extremities: No cyanosis. pulses are equal. Neurologic: Alert and oriented. No involuntary movements.  LABS: Recent Results (from the past 2160 hour(s))  Basic metabolic panel     Status: Abnormal   Collection Time: 06/23/20  6:55 PM  Result Value Ref Range   Sodium 141 135 - 145 mmol/L   Potassium 3.3 (L) 3.5 - 5.1 mmol/L   Chloride 102 98 - 111 mmol/L   CO2 30 22 - 32 mmol/L    Glucose, Bld 100 (H) 70 - 99 mg/dL    Comment: Glucose reference range applies only to samples taken after fasting for at least 8 hours.   BUN 9 6 - 20 mg/dL   Creatinine, Ser 08/24/20 0.44 - 1.00 mg/dL   Calcium 9.3 8.9 - 9.37 mg/dL   GFR calc non Af Amer >60 >60 mL/min   GFR calc Af Amer >60 >60 mL/min   Anion gap 9 5 - 15    Comment: Performed at [facility], [address] [place], [place]  CBC     Status: None   Collection Time: 06/23/20  6:55 PM  Result Value Ref Range   WBC 5.8 4.0 - 10.5 K/uL   RBC 4.29 3.87 - 5.11 MIL/uL   Hemoglobin 13.3 12.0 - 15.0 g/dL   HCT 08/24/20 36 - 46 %   MCV 93.0 80.0 - 100.0 fL   MCH 31.0 26.0 - 34.0 pg   MCHC 33.3 30.0 - 36.0 g/dL   RDW 15.7 26.2 - 03.5 %   Platelets 246 150 - 400 K/uL   nRBC 0.0 0.0 - 0.2 %    Comment: Performed at [facility], [address], [place], [place]  Troponin I (High Sensitivity)     Status: None   Collection Time: 06/23/20  6:55 PM  Result Value Ref Range   Troponin I (High Sensitivity) 5 <18 ng/L    Comment: (NOTE) Elevated high sensitivity troponin I (hsTnI) values and significant  changes across serial measurements may suggest ACS but many other  chronic and acute conditions are known to elevate hsTnI results.  Refer to the "Links" section for chest pain algorithms and additional  guidance. Performed at [facility], [address] [place], [place]   Troponin I (High Sensitivity)     Status: None   Collection Time: 06/24/20 12:36 AM  Result Value Ref Range   Troponin I (High Sensitivity) 5 <18 ng/L    Comment: (NOTE) Elevated high sensitivity troponin I (hsTnI) values and significant  changes across serial measurements may suggest ACS but many other  chronic and acute conditions are known to elevate hsTnI results.  Refer to the "Links" section for chest pain algorithms and additional  guidance. Performed at [facility], [address] [place], [place]     Radiology: CT Angio Chest PE W and/or Wo Contrast  Result Date: 06/24/2020 CLINICAL DATA:  Chest pain. EXAM: CT ANGIOGRAPHY CHEST WITH CONTRAST TECHNIQUE: Multidetector CT imaging of  the chest was performed using the standard protocol during bolus administration of intravenous contrast. Multiplanar CT image reconstructions and MIPs were obtained to evaluate the vascular anatomy. CONTRAST:  32mL OMNIPAQUE IOHEXOL 350 MG/ML SOLN COMPARISON:  December 08, 2019 FINDINGS: Cardiovascular: Satisfactory opacification of the pulmonary arteries to the segmental level. No evidence of pulmonary embolism. Normal heart size. No pericardial effusion. Mediastinum/Nodes: No enlarged mediastinal, hilar, or axillary lymph nodes. Thyroid gland, trachea, and esophagus demonstrate no significant findings. Lungs/Pleura: Lungs are clear. No pleural effusion or pneumothorax. Upper Abdomen: Subcentimeter cyst versus hemangioma is seen within the left lobe of the liver. Musculoskeletal: Bilateral breast implants are seen. No acute or significant osseous findings. Review of the MIP images confirms the above findings. IMPRESSION: 1. No CT evidence of pulmonary embolism. 2. No acute or active cardiopulmonary disease. 3. Subcentimeter cyst versus hemangioma within the left lobe of the liver. Electronically Signed   By: Aram Candela M.D.   On: 06/24/2020 03:29    No results found.  No results found.    Assessment and Plan: Patient Active Problem List   Diagnosis Date Noted  . Biliary dyskinesia   . Mild intermittent asthma without complication 04/06/2018  . Seasonal allergic rhinitis due to pollen 04/06/2018  . Chronic pansinusitis 04/06/2018  . Chronic pelvic pain in female 01/08/2018  . Chronic pain syndrome 07/23/2017  . Left lower quadrant pain 05/27/2017  . Pelvic pain 11/18/2016    1. Chronic obstructive asthma (HCC) Continue inhalers as prescribed.   2. History of  COVID-19 Discussed case with Dr. Freda Munro, patient will be referred to Dr. Jimmey Ralph at [facility] for Long haul covid symptoms.   3. Shortness of breath Cleda Daub stable - Spirometry with Graph  General Counseling: I have discussed the findings of the evaluation and examination with Vanessa Chapman.  I have also discussed any further diagnostic evaluation thatmay be needed or ordered today. Vanessa Chapman verbalizes understanding of the findings of todays visit. We also reviewed her medications today and discussed drug interactions and side effects including but not limited excessive drowsiness and altered mental states. We also discussed that there is always a risk not just to her but also people around her. she has been encouraged to call the office with any questions or concerns that should arise related to todays visit.  Orders Placed This Encounter  Procedures  . Spirometry with Graph    Order Specific Question:   Where should this test be performed?    Answer:   [facility]    Order Specific Question:   Basic spirometry    Answer:   Yes     Time spent: 25 This patient was seen by Blima Ledger AGNP-C in Collaboration with Dr. Freda Munro as a part of collaborative care agreement.   I have personally obtained a history, examined the patient, evaluated laboratory and imaging results, formulated the assessment and plan and placed orders.    Yevonne Pax, MD [facility] Pulmonary and Critical Care Sleep medicine

## 2020-09-18 ENCOUNTER — Ambulatory Visit: Payer: BC Managed Care – PPO | Admitting: Internal Medicine

## 2020-09-21 ENCOUNTER — Encounter: Payer: Self-pay | Admitting: Internal Medicine

## 2020-11-26 ENCOUNTER — Other Ambulatory Visit: Payer: Self-pay | Admitting: Hospice and Palliative Medicine

## 2020-11-26 DIAGNOSIS — J449 Chronic obstructive pulmonary disease, unspecified: Secondary | ICD-10-CM

## 2021-02-05 ENCOUNTER — Other Ambulatory Visit: Payer: Self-pay | Admitting: Obstetrics and Gynecology

## 2021-02-05 ENCOUNTER — Other Ambulatory Visit (HOSPITAL_COMMUNITY): Payer: Self-pay | Admitting: Obstetrics and Gynecology

## 2021-02-05 DIAGNOSIS — R1030 Lower abdominal pain, unspecified: Secondary | ICD-10-CM

## 2021-02-13 ENCOUNTER — Ambulatory Visit (HOSPITAL_COMMUNITY): Payer: BC Managed Care – PPO

## 2021-02-13 ENCOUNTER — Encounter (HOSPITAL_COMMUNITY): Payer: Self-pay

## 2021-02-14 ENCOUNTER — Other Ambulatory Visit: Payer: Self-pay

## 2021-02-14 ENCOUNTER — Encounter: Payer: Self-pay | Admitting: Internal Medicine

## 2021-02-14 MED ORDER — PREDNISONE 10 MG PO TABS: ORAL_TABLET | ORAL | 0 refills | Status: AC

## 2021-02-14 MED ORDER — AZITHROMYCIN 250 MG PO TABS
ORAL_TABLET | ORAL | 0 refills | Status: AC
Start: 2021-02-14 — End: ?

## 2021-02-19 ENCOUNTER — Ambulatory Visit (HOSPITAL_COMMUNITY): Admission: RE | Admit: 2021-02-19 | Payer: BC Managed Care – PPO | Source: Ambulatory Visit

## 2021-03-18 ENCOUNTER — Ambulatory Visit: Payer: BC Managed Care – PPO | Admitting: Internal Medicine

## 2021-04-23 ENCOUNTER — Telehealth (INDEPENDENT_AMBULATORY_CARE_PROVIDER_SITE_OTHER): Payer: BC Managed Care – PPO | Admitting: Nurse Practitioner

## 2021-04-23 DIAGNOSIS — Z8616 Personal history of COVID-19: Secondary | ICD-10-CM

## 2021-04-23 DIAGNOSIS — R5382 Chronic fatigue, unspecified: Secondary | ICD-10-CM

## 2021-04-23 DIAGNOSIS — R0602 Shortness of breath: Secondary | ICD-10-CM

## 2021-04-23 NOTE — Patient Instructions (Addendum)
History of Covid 19 Intermittent chest pain Shortness of breath Fatigue Deconditioning:   Stay well hydrated  Stay active  Deep breathing exercises  May start vitamin C daily, vitamin D3 daily, Zinc daily  May take tylenol for fever or pain  Intermittent chest pain Shortness of breath:  Continue to follow up with cardiology  Will place referral with pulmonary - Dr. Isaiah Serge for second opinion  Fatigue Deconditioning:  Will order PT   Follow up:  Follow up if needed

## 2021-04-23 NOTE — Progress Notes (Signed)
Virtual Visit via Telephone Note  I connected with Vanessa Chapman on 04/23/21 at  8:30 AM EDT by telephone and verified that I am speaking with the correct person using two identifiers.  Location: Patient: home Provider: office   I discussed the limitations, risks, security and privacy concerns of performing an evaluation and management service by telephone and the availability of in person appointments. I also discussed with the patient that there may be a patient responsible charge related to this service. The patient expressed understanding and agreed to proceed.  50 year old female with history of hypertension, allergic asthma, seasonal allergic rhinitis, GERD, hypothyroidism, chronic pain, heart murmur, depression.  History of Present Illness:  Patient presents today for post-COVID care clinic visit through televisit.  Patient states that she tested positive for COVID in November 2020 and March 2022.  She did have monoclonal antibody infusion at [facility] in March 2022.  She has been seeing [facility] cardiology since her first diagnosis of COVID which resulted in issues with breathing and pericarditis.  Patient states she continues to have issues with chest pain, shortness of breath, tachycardia.  She has also recently been referred to neurology for lower extremity tingling and pain.  She does see the asthma and allergy pulmonary doctor.  We discussed that we can get her second opinion with our pulmonary team and also refer her for physical therapy for reconditioning. Denies f/c/s, n/v/d, hemoptysis, PND, chest pain or edema.    Observations/Objective:  Vitals with BMI 09/17/2020 08/15/2020 08/02/2020  Height 5\' 3"  5\' 3"  5\' 3"   Weight 154 lbs 3 oz 150 lbs 153 lbs  BMI 27.32 26.58 27.11  Systolic 138 151  Diastolic 86 88 90  Pulse 57 62 56      Assessment and Plan:  History of Covid 19 Intermittent chest pain Shortness of breath Fatigue Deconditioning:   Stay well  hydrated  Stay active  Deep breathing exercises  May start vitamin C daily, vitamin D3 daily, Zinc daily  May take tylenol for fever or pain  Intermittent chest pain Shortness of breath:  Continue to follow up with cardiology  Will place referral with pulmonary - Dr. for second opinion  Fatigue Deconditioning:  Will order PT   Follow up:  Follow up if needed       I discussed the assessment and treatment plan with the patient. The patient was provided an opportunity to ask questions and all were answered. The patient agreed with the plan and demonstrated an understanding of the instructions.   The patient was advised to call back or seek an in-person evaluation if the symptoms worsen or if the condition fails to improve as anticipated.  I provided 23 minutes of non-face-to-face time during this encounter.   , NP

## 2021-06-04 ENCOUNTER — Other Ambulatory Visit: Payer: Self-pay

## 2021-06-04 ENCOUNTER — Encounter: Payer: Self-pay | Admitting: Physical Therapy

## 2021-06-04 ENCOUNTER — Ambulatory Visit: Payer: BC Managed Care – PPO | Attending: Nurse Practitioner | Admitting: Physical Therapy

## 2021-06-04 VITALS — BP 149/88 | HR 77

## 2021-06-04 DIAGNOSIS — U099 Post covid-19 condition, unspecified: Secondary | ICD-10-CM | POA: Insufficient documentation

## 2021-06-04 DIAGNOSIS — M6281 Muscle weakness (generalized): Secondary | ICD-10-CM | POA: Insufficient documentation

## 2021-06-04 DIAGNOSIS — Z7409 Other reduced mobility: Secondary | ICD-10-CM

## 2021-06-04 NOTE — Therapy (Signed)
[facility] Outpatient Rehabilitation [facility] [address] [place], [place], [address] Phone: [phone]   Fax:  [phone]  Physical Therapy Evaluation  Patient Details  Name: Vanessa Chapman MRN: [id number] Date of Birth: 12/20/1970 Referring Provider (PT): Angus Seller, NP   Encounter Date: 06/04/2021   PT End of Session - 06/04/21 0859     Visit Number 1    Number of Visits 12    Date for PT Re-Evaluation 07/16/21    Authorization Type BCBS    PT Start Time 0750    PT Stop Time 0838    PT Time Calculation (min) 48 min    Activity Tolerance Patient limited by fatigue;Patient tolerated treatment well    Behavior During Therapy Anxious;WFL for tasks assessed/performed             Past Medical History:  Diagnosis Date   Allergic asthma, mild intermittent, uncomplicated    Depression    GERD (gastroesophageal reflux disease)    Heart murmur    asymptomatic   Hypertension    Hypothyroidism    Hypothyroidism    Pelvic pain    PONV (postoperative nausea and vomiting)    years ago   Wears contact lenses     Past Surgical History:  Procedure Laterality Date   ABDOMINAL HYSTERECTOMY     BACK SURGERY     BALLOON SINUPLASTY  10/2015   BREAST ENHANCEMENT SURGERY  2008   BREAST SURGERY     CESAREAN SECTION  1996   ENDOMETRIAL ABLATION W/ NOVASURE  01/2015   ESOPHAGOGASTRODUODENOSCOPY (EGD) WITH PROPOFOL N/A 09/28/2018   Procedure: ESOPHAGOGASTRODUODENOSCOPY (EGD) WITH PROPOFOL;  Surgeon: Toney Reil, MD;  Location: [facility];  Service: Gastroenterology;  Laterality: N/A;   EXCISIONAL HEMORRHOIDECTOMY  09/28/2006   LAPAROSCOPIC VAGINAL HYSTERECTOMY WITH SALPINGECTOMY Bilateral 11/18/2016   Procedure: ATTEMPTED LAPAROSCOPIC ASSISTED VAGINAL HYSTERECTOMY WITH SALPINGECTOMY: LYSIS OF ADHESIONS ABDOMINAL HYSTERECTOMY Elsie Saas SALPIGECTOMY;  Surgeon: Richarda Overlie, MD;  Location: [facility];  Service: Gynecology;   Laterality: Bilateral;   LUMBAR LAMINECTOMY  12/05/2008   L4 -- L5   MICROTUBOPLASTY  2009   tubal ligation reversal   ROBOTIC ASSISTED LAPAROSCOPIC CHOLECYSTECTOMY-MULTI SITE N/A 10/22/2018   Procedure: ROBOTIC ASSISTED LAPAROSCOPIC CHOLECYSTECTOMY-MULTI SITE;  Surgeon: Leafy Ro, MD;  Location: [facility];  Service: General;  Laterality: N/A;   TUBAL LIGATION Bilateral 1998    Vitals:   06/04/21 0753  BP: (!) 149/88  Pulse: 77  SpO2: 99%      Subjective Assessment - 06/04/21 0753     Subjective Patient presents with post COVID symptoms.  She had COVID-19 in Nov. 2020 and also in March 2022.   She cont to have flare ups with mucus, coughing. She has pain in chest, upper back which varies day today with positioning and other sx.  She has been to the ED for this.  Pt has difficulty with functional mobility due to fatigue which affects her ability to walk, run errands, dyspnea on exertion (walking the dog). She has trouble even getting dressed and is exhausted with simple tasks.  She has brain fog.  Denies numbness and tingling in extremities.  Does endorse nerve pain in mid and upper back. She is understandably frustrated with this and would like to regain her quality of life.    Pertinent History post covid pericarditis,  asthma,  low thyroid, hypertension, lumbar discectomy, pelvic pain    Limitations Sitting;House hold activities;Lifting;Standing;Walking;Other (comment)   sleep   How long  can you walk comfortably? walking the dog 10 min, can be very short of breath and can do about 20 min but is exhausted    Diagnostic tests heart and lung    Patient Stated Goals Patient would like to be able to increase energy, endurance and enjoy life    Currently in Pain? 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 , dyspnea    Referring Provider (PT) Angus Seller, NP    Onset Date/Surgical Date --   Nov, 2020   Next MD Visit as needed for multiple issues    Prior Therapy Yes for low back      Precautions   Precautions None    Precaution Comments monitor Sa O2, BP      Restrictions   Weight Bearing Restrictions No      Balance Screen   Has the patient fallen in the past 6 months No      Prior Function   Level of Independence Independent with basic ADLs;Needs assistance with homemaking    Vocation Full time employment    Vocation Requirements remote    Leisure beach, travelling, dog, family      Cognition   Overall Cognitive Status Impaired/Different from baseline    Area of Impairment Problem solving;Memory    Memory Comments brain fog    Problem Solving Comments brain fog    Behaviors Other (comment)   tearful, slightly anxious     Observation/Other Assessments   Focus on Therapeutic Outcomes (FOTO)  44%      Sensation   Light Touch Appears Intact      Coordination   Gross Motor Movements are Fluid and Coordinated Not tested      Posture/Postural Control   Posture/Postural Control Postural limitations    Postural Limitations Rounded Shoulders;Forward head      Strength   Right/Left Shoulder --   biceps 4/5 bilat. pain from L shoulder to L hip, back w MMT   Right Shoulder Flexion 4+/5    Right Shoulder ABduction 4+/5    Left Shoulder Flexion 4+/5    Left Shoulder ABduction 4+/5    Right Hip Flexion 4/5    Left Hip Flexion 4/5    Right Knee Flexion 4+/5    Right Knee Extension 5/5    Left Knee Flexion 4+/5     Left Knee Extension 5/5      Palpation   Palpation comment pain in L medial scapular border and centrally in spine      6 Minute Walk- Baseline   6 Minute Walk- Baseline yes    BP (mmHg) 149/88    HR (bpm) 69    02 Sat (%RA) 97 %    Modified Borg Scale for Dyspnea  2- Mild shortness of breath    Perceived Rate of Exertion (Borg) 11- Fairly light      6 Minute walk- Post Test   6 Minute Walk Post Test yes    BP (mmHg) 145/100    HR (bpm) 83    02 Sat (%RA) 85 %    Modified Borg Scale for Dyspnea 2- Mild shortness of breath    Perceived Rate of Exertion (Borg) 11- Fairly light      6 minute walk test results    Aerobic Endurance Distance Walked 2590    Endurance additional comments 6 + laps, 3 min in, increase in chest and back pain                  Objective measurements completed on examination: See above findings.          PT Education - 06/04/21 0859     Education Details PT/POC    Person(s) Educated Patient    Methods Explanation    Comprehension Verbalized understanding;Returned demonstration                 PT Long Term Goals - 06/04/21 0900       PT LONG TERM GOAL #1   Title Pt will be I with HEP for endurance, overall strength upon discharge    Time 6    Period Weeks    Status New    Target Date 07/16/21      PT LONG TERM GOAL #2   Title Pt will be able to walk (with dog)  15-20 min with no excessive dyspnea ( < 2/10 on Mod Borg)    Time 6    Period Weeks    Status New    Target Date 07/16/21      PT LONG TERM GOAL #3   Title Pt will be able to complete ADLs with dyspnea (1/10 or less)    Time 6    Period Weeks    Status New    Target Date 07/16/21      PT LONG TERM GOAL #4   Title Pt will be able to reduce pain using positioning, stretching as a daily routine to improve her ability to work and complete ADLs, IADLs    Time 6    Period Weeks    Status New    Target Date 07/16/21      PT LONG TERM GOAL #5   Title FOTO  score will improve to 57% able to demo functional mobility  improvement    Time 6    Period Weeks    Status New    Target Date 07/16/21                    Plan - 06/04/21 0905     Clinical Impression Statement Patient presents for mod complexity evaluation for Post-COVID syndrome fatigue and dyspnea. She presents with variable pain in mid back and chest, decreased endurance (O2 sats 85% with exertion) and weakness in UE/LE. She did have elevated BP with 6 min walk. Her pain may not be musculoskeletal in nature but PT will aim to improve spinal mobility, UE and LE strength to support movement and posture.  Was not able to give HEP due to time but will do so ASAP.  Pt needs skilled PT for guidance with activity and monitored cardiorespiratory function.    Personal Factors and Comorbidities Comorbidity 3+;Time since onset of injury/illness/exacerbation    Comorbidities asthma, lumbar, chronic  ongoing fatigue due to post COVID cardiac and pulmonary issues.    Examination-Activity Limitations Bathing;Squat;Lift;Bend;Locomotion Level;Stand;Reach Overhead;Carry;Sit;Sleep;Dressing    Examination-Participation Restrictions Laundry;Cleaning;Shop;Community Activity;Meal Prep;Driving;Occupation;Yard Work;Interpersonal Relationship    Stability/Clinical Decision Making Evolving/Moderate complexity    Clinical Decision Making Moderate    Rehab Potential Excellent    PT Frequency 2x / week    PT Duration 6 weeks    PT Treatment/Interventions ADLs/Self Care Home Management;Cryotherapy;Moist Heat;Functional mobility training;Therapeutic activities;Therapeutic exercise;Patient/family education;Manual techniques;Taping    PT Next Visit Plan HEP, endurance, test 5 x STS and reps for 30 sec. monitor BP, HR and Sa o2    PT Home Exercise Plan none given on eval    Consulted and Agree with Plan of Care Patient             Patient will benefit from skilled therapeutic intervention in order to improve  the following deficits and impairments:  Cardiopulmonary status limiting activity, Decreased activity tolerance, Decreased strength, Increased fascial restricitons, Impaired flexibility, Impaired UE functional use, Postural dysfunction, Pain, Decreased mobility, Decreased endurance  Visit Diagnosis: Post-COVID syndrome  Muscle weakness (generalized)  Post-COVID chronic decreased mobility and endurance     Problem List Patient Active Problem List   Diagnosis Date Noted   Biliary dyskinesia    Mild intermittent asthma without complication 04/06/2018   Seasonal allergic rhinitis due to pollen 04/06/2018   Chronic pansinusitis 04/06/2018   Chronic pelvic pain in female 01/08/2018   Chronic pain syndrome 07/23/2017   Left lower quadrant pain 05/27/2017   Pelvic pain 11/18/2016    Sahaana Weitman 06/04/2021, 9:16 AM  [facility] [address] [place], [place], [address] Phone: [phone]   Fax:  [phone]  Name: Kateri McKaren Crutchfield Masse MRN: [id number] Date of Birth: 29-Nov-1971  Karie MainlandJennifer Ilona Colley, PT 06/04/21 9:16 AM Phone: [phone] Fax: [phone]

## 2021-06-05 ENCOUNTER — Ambulatory Visit: Payer: BC Managed Care – PPO | Admitting: Physical Therapy

## 2021-06-05 ENCOUNTER — Encounter: Payer: Self-pay | Admitting: Physical Therapy

## 2021-06-05 VITALS — BP 132/91 | HR 60

## 2021-06-05 DIAGNOSIS — U099 Post covid-19 condition, unspecified: Secondary | ICD-10-CM

## 2021-06-05 DIAGNOSIS — M6281 Muscle weakness (generalized): Secondary | ICD-10-CM

## 2021-06-05 DIAGNOSIS — Z7409 Other reduced mobility: Secondary | ICD-10-CM

## 2021-06-05 NOTE — Patient Instructions (Signed)
Access Code: [email or web address] URL: [email or web address] Date: 06/05/2021 Prepared by: Jannette Spanner  Exercises Supine Bridge - 1 x daily - 7 x weekly - 3 sets - 10 reps - 5 hold Sit to Stand - 1 x daily - 7 x weekly - 3 sets - 10 reps Step Up - 1 x daily - 7 x weekly - 3 sets - 10 reps

## 2021-06-05 NOTE — Therapy (Signed)
[facility] Outpatient Rehabilitation [facility] [address] [place], [place], [address] Phone: [phone]   Fax:  [phone]  Physical Therapy Treatment  Patient Details  Name: Devan Babino MRN: [id number] Date of Birth: 10-Apr-1971 Referring Provider (PT): Angus Seller, NP   Encounter Date: 06/05/2021   PT End of Session - 06/05/21 0733     Visit Number 2    Number of Visits 12    Date for PT Re-Evaluation 07/16/21    Authorization Type BCBS    PT Start Time 0717    PT Stop Time 0810    PT Time Calculation (min) 53 min             Past Medical History:  Diagnosis Date   Allergic asthma, mild intermittent, uncomplicated    Depression    GERD (gastroesophageal reflux disease)    Heart murmur    asymptomatic   Hypertension    Hypothyroidism    Hypothyroidism    Pelvic pain    PONV (postoperative nausea and vomiting)    years ago   Wears contact lenses     Past Surgical History:  Procedure Laterality Date   ABDOMINAL HYSTERECTOMY     BACK SURGERY     BALLOON SINUPLASTY  10/2015   BREAST ENHANCEMENT SURGERY  2008   BREAST SURGERY     CESAREAN SECTION  1996   ENDOMETRIAL ABLATION W/ NOVASURE  01/2015   ESOPHAGOGASTRODUODENOSCOPY (EGD) WITH PROPOFOL N/A 09/28/2018   Procedure: ESOPHAGOGASTRODUODENOSCOPY (EGD) WITH PROPOFOL;  Surgeon: Toney Reil, MD;  Location: [facility];  Service: Gastroenterology;  Laterality: N/A;   EXCISIONAL HEMORRHOIDECTOMY  09/28/2006   LAPAROSCOPIC VAGINAL HYSTERECTOMY WITH SALPINGECTOMY Bilateral 11/18/2016   Procedure: ATTEMPTED LAPAROSCOPIC ASSISTED VAGINAL HYSTERECTOMY WITH SALPINGECTOMY: LYSIS OF ADHESIONS ABDOMINAL HYSTERECTOMY Elsie Saas SALPIGECTOMY;  Surgeon: Richarda Overlie, MD;  Location: [facility];  Service: Gynecology;  Laterality: Bilateral;   LUMBAR LAMINECTOMY  12/05/2008   L4 -- L5   MICROTUBOPLASTY  2009   tubal ligation reversal   ROBOTIC ASSISTED LAPAROSCOPIC  CHOLECYSTECTOMY-MULTI SITE N/A 10/22/2018   Procedure: ROBOTIC ASSISTED LAPAROSCOPIC CHOLECYSTECTOMY-MULTI SITE;  Surgeon: Leafy Ro, MD;  Location: [facility];  Service: General;  Laterality: N/A;   TUBAL LIGATION Bilateral 1998    Vitals:   06/05/21 0803 06/05/21 0807  BP: (!) 149/84 (!) 132/91  Pulse:  60  SpO2: (!) 83%      Subjective Assessment - 06/05/21 0734     Subjective Pt reports mild pain today deep in chest and upper back. She reports it is better than yesterday and she feels overall better today.    Currently in Pain? Yes    Pain Score 2     Pain Location Scapula    Pain Orientation Mid    Pain Descriptors / Indicators Discomfort    Pain Type Chronic pain    Pain Radiating Towards mid back to shoulder blades                [facility] PT Assessment - 06/05/21 0001       Transfers   Comments 11 reps STS  in 30 sec      High Level Balance   High Level Balance Comments 13.5 11 reps                           [facility] Adult PT Treatment/Exercise - 06/05/21 0001       Transfers   Five time sit  to stand comments  13.5 sec      Exercises   Exercises Knee/Hip      Knee/Hip Exercises: Stretches   Other Knee/Hip Stretches SKTC 20 sec x 3 each, LTR x 1 each way discontinued due to increased LBP   upon sitting after theses streches, SPO2 83% , returned to 98% after 3 minutes in sitting     Knee/Hip Exercises: Aerobic   Nustep Nustep L3 x 5 minutes UE/LE   1/10 Mod Borg     Knee/Hip Exercises: Machines for Strengthening   Cybex Leg Press 1 plate 10 x 2   9/47 Mod Borg, 99%, 65 bpm   Other Machine UBE L1 2.5 minutes forward 2/10 on Mod Borg , 2.5 minutes backward MOD Borg 3/10   97% SPO2, 68 bpm HR     Knee/Hip Exercises: Standing   Other Standing Knee Exercises step up intervals 10 reps then rest 3 seconds : 3 rounds ; HR increased to 102 bpm , 3/10 MOD BORG, BP : 127/88   2/10 Mod Borg, 3/10, 106 HR on third set     Knee/Hip Exercises: Supine    Bridges 10 reps   98 % HR 65 bpm   Bridges Limitations 2 sets                         PT Long Term Goals - 06/04/21 0900       PT LONG TERM GOAL #1   Title Pt will be I with HEP for endurance, overall strength upon discharge    Time 6    Period Weeks    Status New    Target Date 07/16/21      PT LONG TERM GOAL #2   Title Pt will be able to walk (with dog)  15-20 min with no excessive dyspnea ( < 2/10 on Mod Borg)    Time 6    Period Weeks    Status New    Target Date 07/16/21      PT LONG TERM GOAL #3   Title Pt will be able to complete ADLs with dyspnea (1/10 or less)    Time 6    Period Weeks    Status New    Target Date 07/16/21      PT LONG TERM GOAL #4   Title Pt will be able to reduce pain using positioning, stretching as a daily routine to improve her ability to work and complete ADLs, IADLs    Time 6    Period Weeks    Status New    Target Date 07/16/21      PT LONG TERM GOAL #5   Title FOTO score will improve to 57% able to demo functional mobility  improvement    Time 6    Period Weeks    Status New    Target Date 07/16/21                   Plan - 06/05/21 0962     Clinical Impression Statement Pt arrives feeling better in regard to pain and energy compared to yesterday at her evaluation. Began Aerobic exercise while monitoring vitals. Dyspnea HR and SPO2% remained WNL during 10 minutes of seated aerobic machines. Began leg press which she reported increased back pain-like knives in her back, vitals WNL. Began 25 sec intervals with 30 sec rest for 8 inch step  ups which increased her HR and dyspnea however SPO2 remained 96%  and above. Attempted qped stretching for her back but it increased her pain so discontinued. Attempted supine lumbar stretching which also increased her pain. Bridges were tolerated well without increased pain or decrease in SPO2%. Upon sitting up after mat therex, her SPO2 dropped to 83% with BP 147/84. After  several minutes her SPO2 returned to 98%. She reported feeling good after session. She was given Bridge, sit-stands and step ups for HEP.    PT Next Visit Plan HEP, endurance, . monitor BP, HR and Sa o2. sit to stands, bridge, step up intervals, watch SPO2 with positional changes    PT Home Exercise Plan [email or web address]- bridges, step ups, sit-stands (30 sec rest between sets)    Consulted and Agree with Plan of Care Patient             Patient will benefit from skilled therapeutic intervention in order to improve the following deficits and impairments:  Cardiopulmonary status limiting activity, Decreased activity tolerance, Decreased strength, Increased fascial restricitons, Impaired flexibility, Impaired UE functional use, Postural dysfunction, Pain, Decreased mobility, Decreased endurance  Visit Diagnosis: Post-COVID syndrome  Muscle weakness (generalized)  Post-COVID chronic decreased mobility and endurance     Problem List Patient Active Problem List   Diagnosis Date Noted   Biliary dyskinesia    Mild intermittent asthma without complication 04/06/2018   Seasonal allergic rhinitis due to pollen 04/06/2018   Chronic pansinusitis 04/06/2018   Chronic pelvic pain in female 01/08/2018   Chronic pain syndrome 07/23/2017   Left lower quadrant pain 05/27/2017   Pelvic pain 11/18/2016    Sherrie Mustache, PTA 06/05/2021, 9:09 AM  [facility] [address] [place], [place], [address] Phone: [phone]   Fax:  [phone]  Name: Braileigh Landenberger MRN: [id number] Date of Birth: 02-Apr-1971

## 2021-06-07 ENCOUNTER — Other Ambulatory Visit: Payer: Self-pay | Admitting: Physician Assistant

## 2021-06-07 ENCOUNTER — Other Ambulatory Visit (HOSPITAL_COMMUNITY): Payer: Self-pay | Admitting: Physician Assistant

## 2021-06-07 DIAGNOSIS — R200 Anesthesia of skin: Secondary | ICD-10-CM

## 2021-06-07 DIAGNOSIS — R5383 Other fatigue: Secondary | ICD-10-CM

## 2021-06-07 DIAGNOSIS — G8929 Other chronic pain: Secondary | ICD-10-CM

## 2021-06-07 DIAGNOSIS — R202 Paresthesia of skin: Secondary | ICD-10-CM

## 2021-06-07 DIAGNOSIS — M25512 Pain in left shoulder: Secondary | ICD-10-CM

## 2021-06-10 ENCOUNTER — Encounter: Payer: Self-pay | Admitting: Physical Therapy

## 2021-06-10 ENCOUNTER — Ambulatory Visit: Payer: BC Managed Care – PPO | Admitting: Physical Therapy

## 2021-06-10 ENCOUNTER — Other Ambulatory Visit: Payer: Self-pay

## 2021-06-10 VITALS — BP 139/85 | HR 63

## 2021-06-10 DIAGNOSIS — M6281 Muscle weakness (generalized): Secondary | ICD-10-CM

## 2021-06-10 DIAGNOSIS — Z7409 Other reduced mobility: Secondary | ICD-10-CM

## 2021-06-10 DIAGNOSIS — U099 Post covid-19 condition, unspecified: Secondary | ICD-10-CM | POA: Diagnosis not present

## 2021-06-10 NOTE — Therapy (Signed)
[facility] [address] [place], [place], [address] Phone: [phone]   Fax:  [phone]  Physical Therapy Treatment  Patient Details  Name: Vanessa Chapman MRN: [id number] Date of Birth: 15-Apr-1971 Referring Provider (PT): Angus Seller, NP   Encounter Date: 06/10/2021   PT End of Session - 06/10/21 0900     Visit Number 3    Number of Visits 12    Date for PT Re-Evaluation 07/16/21    Authorization Type BCBS    PT Start Time 0715    PT Stop Time 0801    PT Time Calculation (min) 46 min    Activity Tolerance Patient limited by fatigue;Patient tolerated treatment well    Behavior During Therapy Anxious;WFL for tasks assessed/performed             Past Medical History:  Diagnosis Date   Allergic asthma, mild intermittent, uncomplicated    Depression    GERD (gastroesophageal reflux disease)    Heart murmur    asymptomatic   Hypertension    Hypothyroidism    Hypothyroidism    Pelvic pain    PONV (postoperative nausea and vomiting)    years ago   Wears contact lenses     Past Surgical History:  Procedure Laterality Date   ABDOMINAL HYSTERECTOMY     BACK SURGERY     BALLOON SINUPLASTY  10/2015   BREAST ENHANCEMENT SURGERY  2008   BREAST SURGERY     CESAREAN SECTION  1996   ENDOMETRIAL ABLATION W/ NOVASURE  01/2015   ESOPHAGOGASTRODUODENOSCOPY (EGD) WITH PROPOFOL N/A 09/28/2018   Procedure: ESOPHAGOGASTRODUODENOSCOPY (EGD) WITH PROPOFOL;  Surgeon: Toney Reil, MD;  Location: [facility];  Service: Gastroenterology;  Laterality: N/A;   EXCISIONAL HEMORRHOIDECTOMY  09/28/2006   LAPAROSCOPIC VAGINAL HYSTERECTOMY WITH SALPINGECTOMY Bilateral 11/18/2016   Procedure: ATTEMPTED LAPAROSCOPIC ASSISTED VAGINAL HYSTERECTOMY WITH SALPINGECTOMY: LYSIS OF ADHESIONS ABDOMINAL HYSTERECTOMY Elsie Saas SALPIGECTOMY;  Surgeon: Richarda Overlie, MD;  Location: [facility];  Service: Gynecology;   Laterality: Bilateral;   LUMBAR LAMINECTOMY  12/05/2008   L4 -- L5   MICROTUBOPLASTY  2009   tubal ligation reversal   ROBOTIC ASSISTED LAPAROSCOPIC CHOLECYSTECTOMY-MULTI SITE N/A 10/22/2018   Procedure: ROBOTIC ASSISTED LAPAROSCOPIC CHOLECYSTECTOMY-MULTI SITE;  Surgeon: Leafy Ro, MD;  Location: [facility];  Service: General;  Laterality: N/A;   TUBAL LIGATION Bilateral 1998    Vitals:   06/10/21 0722  BP: 139/85  Pulse: 63  SpO2: 95%     Subjective Assessment - 06/10/21 0717     Subjective I felt okay after last session. I did my exercises.    Currently in Pain? Yes    Pain Score 3     Pain Location Scapula    Pain Orientation Left    Pain Descriptors / Indicators Tightness;Sharp    Pain Type Chronic pain    Multiple Pain Sites Yes    Pain Score 5    Pain Location Back    Pain Orientation Left;Lower    Pain Descriptors / Indicators Clance Boll Adult PT Treatment/Exercise - 06/10/21 0001       Knee/Hip Exercises: Aerobic   Nustep Nustep L4 x 1 minute then 30 sec fast with 1 minutes rests through 6 minutes, then moderate pace for 4.5 minutes SPO@ 92%, last 30 sec cool down  total 10 minutes, Hr 77bpm, SPO2 - one episode of 88% when patient was speaking otherwise 92% and above     Knee/Hip Exercises: Machines for Strengthening   Other Machine UBE level 1 2 minutes forward, 30 sec fast 1 minute slower, then 30 sec fast, 1 min slower, then reverse 1 minute, then 30 sec faster, 1 minute slower, 30 sec faster   total 10 minutes, lowest SPO2 94%, HR 78 bpm     Knee/Hip Exercises: Standing   Heel Raises 20 reps    Heel Raises Limitations 96% , 79 bpm    Other Standing Knee Exercises standing with arms above head- march with cross knee touch x 1 minute , SPo2 94% HR 82 bpm    Other Standing Knee Exercises step up interval 30 sec , rest 30 sec   round 1, 2, 3, 4, 5                        PT Long Term Goals -  06/04/21 0900       PT LONG TERM GOAL #1   Title Pt will be I with HEP for endurance, overall strength upon discharge    Time 6    Period Weeks    Status New    Target Date 07/16/21      PT LONG TERM GOAL #2   Title Pt will be able to walk (with dog)  15-20 min with no excessive dyspnea ( < 2/10 on Mod Borg)    Time 6    Period Weeks    Status New    Target Date 07/16/21      PT LONG TERM GOAL #3   Title Pt will be able to complete ADLs with dyspnea (1/10 or less)    Time 6    Period Weeks    Status New    Target Date 07/16/21      PT LONG TERM GOAL #4   Title Pt will be able to reduce pain using positioning, stretching as a daily routine to improve her ability to work and complete ADLs, IADLs    Time 6    Period Weeks    Status New    Target Date 07/16/21      PT LONG TERM GOAL #5   Title FOTO score will improve to 57% able to demo functional mobility  improvement    Time 6    Period Weeks    Status New    Target Date 07/16/21                   Plan - 06/10/21 0901     Clinical Impression Statement Pt reports her lower back remians tight and she has mild pain in upper back. Vitals are normal at start of session and she has no SOB. She reports compliance iwth HEP and also helped her daughter move her household.  Increased challenge on aeobic machines with intervals today with SPO2 dropping below 90% x 2 with quick recovery. Decreased Sats also associated with talking while exercising. She tolerated step up intervals well and could possibly tolerate a UE motion with these. She was challenged with standing march with knee taps, although her vitals remained WNL. Mod borg 1/10 with all therex except UBE and standing knee touches were 3/10.    PT Next Visit Plan HEP, endurance, . monitor BP, HR and Sa o2. sit to stands, bridge, step up intervals, watch SPO2 with positional changes,  consider adding UE to step ups, consider walking while talking, overhead press with or  without march, wall push ups    PT Home Exercise Plan [id number]- bridges, step ups, sit-stands (30 sec rest between sets)             Patient will benefit from skilled therapeutic intervention in order to improve the following deficits and impairments:  Cardiopulmonary status limiting activity, Decreased activity tolerance, Decreased strength, Increased fascial restricitons, Impaired flexibility, Impaired UE functional use, Postural dysfunction, Pain, Decreased mobility, Decreased endurance  Visit Diagnosis: Post-COVID syndrome  Muscle weakness (generalized)  Post-COVID chronic decreased mobility and endurance     Problem List Patient Active Problem List   Diagnosis Date Noted   Biliary dyskinesia    Mild intermittent asthma without complication 04/06/2018   Seasonal allergic rhinitis due to pollen 04/06/2018   Chronic pansinusitis 04/06/2018   Chronic pelvic pain in female 01/08/2018   Chronic pain syndrome 07/23/2017   Left lower quadrant pain 05/27/2017   Pelvic pain 11/18/2016    Sherrie Mustache, PTA 06/10/2021, 9:07 AM  [facility] [address] [place], [place], [address] Phone: [phone]   Fax:  [phone]  Name: Vanessa Chapman MRN: [id number] Date of Birth: 07-04-1971

## 2021-06-12 ENCOUNTER — Ambulatory Visit: Payer: BC Managed Care – PPO | Admitting: Internal Medicine

## 2021-06-13 ENCOUNTER — Encounter: Payer: Self-pay | Admitting: Physical Therapy

## 2021-06-13 ENCOUNTER — Ambulatory Visit: Payer: BC Managed Care – PPO | Admitting: Physical Therapy

## 2021-06-13 ENCOUNTER — Other Ambulatory Visit: Payer: Self-pay

## 2021-06-13 VITALS — BP 138/81 | HR 67

## 2021-06-13 DIAGNOSIS — U099 Post covid-19 condition, unspecified: Secondary | ICD-10-CM | POA: Diagnosis not present

## 2021-06-13 DIAGNOSIS — M6281 Muscle weakness (generalized): Secondary | ICD-10-CM

## 2021-06-13 DIAGNOSIS — Z7409 Other reduced mobility: Secondary | ICD-10-CM

## 2021-06-13 NOTE — Therapy (Signed)
[facility] [address] [place], [place], [address] Phone: [phone]   Fax:  [phone]  Physical Therapy Treatment  Patient Details  Name: Maeva Dant MRN: [id number] Date of Birth: [birth year] Referring Provider (PT): Angus Seller, NP   Encounter Date: 06/13/2021   PT End of Session - 06/13/21 0735     Visit Number 4    Number of Visits 12    Date for PT Re-Evaluation 07/16/21    Authorization Type BCBS    PT Start Time 0715    PT Stop Time 0800    PT Time Calculation (min) 45 min             Past Medical History:  Diagnosis Date   Allergic asthma, mild intermittent, uncomplicated    Depression    GERD (gastroesophageal reflux disease)    Heart murmur    asymptomatic   Hypertension    Hypothyroidism    Hypothyroidism    Pelvic pain    PONV (postoperative nausea and vomiting)    years ago   Wears contact lenses     Past Surgical History:  Procedure Laterality Date   ABDOMINAL HYSTERECTOMY     BACK SURGERY     BALLOON SINUPLASTY  10/2015   BREAST ENHANCEMENT SURGERY  2008   BREAST SURGERY     CESAREAN SECTION  1996   ENDOMETRIAL ABLATION W/ NOVASURE  01/2015   ESOPHAGOGASTRODUODENOSCOPY (EGD) WITH PROPOFOL N/A 09/28/2018   Procedure: ESOPHAGOGASTRODUODENOSCOPY (EGD) WITH PROPOFOL;  Surgeon: Toney Reil, MD;  Location: [facility];  Service: Gastroenterology;  Laterality: N/A;   EXCISIONAL HEMORRHOIDECTOMY  09/28/2006   LAPAROSCOPIC VAGINAL HYSTERECTOMY WITH SALPINGECTOMY Bilateral 11/18/2016   Procedure: ATTEMPTED LAPAROSCOPIC ASSISTED VAGINAL HYSTERECTOMY WITH SALPINGECTOMY: LYSIS OF ADHESIONS ABDOMINAL HYSTERECTOMY Elsie Saas SALPIGECTOMY;  Surgeon: Richarda Overlie, MD;  Location: [facility] [place];  Service: Gynecology;  Laterality: Bilateral;   LUMBAR LAMINECTOMY  12/05/2008   L4 -- L5   MICROTUBOPLASTY  2009   tubal ligation reversal   ROBOTIC ASSISTED LAPAROSCOPIC  CHOLECYSTECTOMY-MULTI SITE N/A 10/22/2018   Procedure: ROBOTIC ASSISTED LAPAROSCOPIC CHOLECYSTECTOMY-MULTI SITE;  Surgeon: Leafy Ro, MD;  Location: [facility];  Service: General;  Laterality: N/A;   TUBAL LIGATION Bilateral 1998    Vitals:   06/13/21 0719 06/13/21 0735  BP: (!) 151/92 138/81  Pulse: 65 67  SpO2: 98%      Subjective Assessment - 06/13/21 0735     Subjective I am not walking at home since I started PT but my husband and I had started walking 20 minutes. My back is not hurting much right  now. Low back stays tight and I modify things.    Pertinent History post covid pericarditis,  asthma,  low thyroid, hypertension, lumbar discectomy, pelvic pain    Limitations Sitting;House hold activities;Lifting;Standing;Walking;Other (comment)    How long can you walk comfortably? walking the dog 10 min, can be very short of breath and can do about 20 min but is exhausted    Patient Stated Goals Patient would like to be able to increase energy, endurance and enjoy life    Currently in Pain? Yes    Pain Score 1     Pain Location Scapula    Pain Orientation Left    Pain Descriptors / Indicators Sharp    Pain Type Chronic pain    Pain Score 3    Pain Location Back    Pain Orientation Lower;Left    Pain Descriptors / Indicators Dull;Aching;Tightness  [facility] Adult PT Treatment/Exercise - 06/13/21 0001       Knee/Hip Exercises: Stretches   Other Knee/Hip Stretches Qped childs pose and cat/ camel with head up      Knee/Hip Exercises: Aerobic   Nustep L5 1 minute intervals UE/LE @ 85 SPM/ 65 SPM   10 minutes total,  HR 73, SPO2 100% at end of last interval, 1-2/10 on Modified BORG     Knee/Hip Exercises: Machines for Strengthening   Other Machine UBE L1 , one miunute intervals 30 RPM/ 50RPM, 7 minutes total, Mod Borg 3/10   SPO2 90% at end of 7 minutes however returned to normal within 1 minute of rest .     Knee/Hip Exercises:  Standing   Heel Raises 20 reps    Heel Raises Limitations with over head raise of arms, 2 bouts with 1 minute rest   99% 77 HR, modified Borg 3/10   Other Standing Knee Exercises 1 minute step up intervals (knee drive and runners arm) with 1 minute rest 96% 85 HR after first round, 98% Hr 90 bpm Mod Borg 3-4/10                    PT Education - 06/13/21 0814     Education Details HEP    Person(s) Educated Patient    Methods Explanation;Handout    Comprehension Returned demonstration                 PT Long Term Goals - 06/04/21 0900       PT LONG TERM GOAL #1   Title Pt will be I with HEP for endurance, overall strength upon discharge    Time 6    Period Weeks    Status New    Target Date 07/16/21      PT LONG TERM GOAL #2   Title Pt will be able to walk (with dog)  15-20 min with no excessive dyspnea ( < 2/10 on Mod Borg)    Time 6    Period Weeks    Status New    Target Date 07/16/21      PT LONG TERM GOAL #3   Title Pt will be able to complete ADLs with dyspnea (1/10 or less)    Time 6    Period Weeks    Status New    Target Date 07/16/21      PT LONG TERM GOAL #4   Title Pt will be able to reduce pain using positioning, stretching as a daily routine to improve her ability to work and complete ADLs, IADLs    Time 6    Period Weeks    Status New    Target Date 07/16/21      PT LONG TERM GOAL #5   Title FOTO score will improve to 57% able to demo functional mobility  improvement    Time 6    Period Weeks    Status New    Target Date 07/16/21                   Plan - 06/13/21 0815     Clinical Impression Statement Pt arrives with reports of low level pain in upper back/chest and min low back tightness at start of session. Progressed with 1  minute intervals of Aerobic machines whcih is tolerated well per her vitals. She does have slight increased in upper back and chest when using UBE and feels min increased breathlessness to 3/10  (  moderate) on the mod borg scale. Continued with aerobic challenges with steps and heel raises, adding arm motions with vitals remaining WNLs. She reports fatigue after session but no lingering SOB and vitals stable. At home she reports fatigue with anything she tries to do like cooking dinner. She was walking 20 minutes a day prior to starting PT and was encouraged to resume this on days she does not have PT.    Comorbidities asthma, lumbar, chronic ongoing fatigue due to post COVID cardiac and pulmonary issues.    Examination-Activity Limitations Bathing;Squat;Lift;Bend;Locomotion Level;Stand;Reach Overhead;Carry;Sit;Sleep;Dressing    PT Next Visit Plan HEP, endurance, . monitor BP, HR and Sa o2. sit to stands, bridge, step up intervals, watch SPO2 with positional changes, continue adding UE movements to therex/ intervals, consider walking while talking, overhead press with or without march, wall push ups    PT Home Exercise Plan [id number]- bridges, step ups, sit-stands (30 sec rest between sets) added heel raise with over head motion.    Consulted and Agree with Plan of Care Patient             Patient will benefit from skilled therapeutic intervention in order to improve the following deficits and impairments:  Cardiopulmonary status limiting activity, Decreased activity tolerance, Decreased strength, Increased fascial restricitons, Impaired flexibility, Impaired UE functional use, Postural dysfunction, Pain, Decreased mobility, Decreased endurance  Visit Diagnosis: Post-COVID syndrome  Post-COVID chronic decreased mobility and endurance  Muscle weakness (generalized)     Problem List Patient Active Problem List   Diagnosis Date Noted   Biliary dyskinesia    Mild intermittent asthma without complication 04/06/2018   Seasonal allergic rhinitis due to pollen 04/06/2018   Chronic pansinusitis 04/06/2018   Chronic pelvic pain in female 01/08/2018   Chronic pain syndrome 07/23/2017    Left lower quadrant pain 05/27/2017   Pelvic pain 11/18/2016    Sherrie Mustache, PTA 06/13/2021, 9:12 AM  [facility] [address] [place], [place], [address] Phone: [phone]   Fax:  [phone]  Name: Carmelina Balducci MRN: [id number] Date of Birth: 1971-01-01

## 2021-06-13 NOTE — Patient Instructions (Signed)
Access Code: [email or web address] URL: [email or web address] Date: 06/13/2021 Prepared by: Jannette Spanner  Exercises Supine Bridge - 1 x daily - 7 x weekly - 3 sets - 10 reps - 5 hold Sit to Stand - 1 x daily - 7 x weekly - 3 sets - 10 reps Step Up - 1 x daily - 7 x weekly - 3 sets - 10 reps Standing Heel Raise - 1 x daily - 7 x weekly - 2 sets - 10 reps

## 2021-06-24 ENCOUNTER — Ambulatory Visit: Payer: BC Managed Care – PPO | Admitting: Physical Therapy

## 2021-06-25 ENCOUNTER — Ambulatory Visit: Payer: BC Managed Care – PPO

## 2021-06-27 ENCOUNTER — Encounter: Payer: Self-pay | Admitting: Physical Therapy

## 2021-06-27 ENCOUNTER — Other Ambulatory Visit: Payer: Self-pay

## 2021-06-27 ENCOUNTER — Ambulatory Visit: Payer: BC Managed Care – PPO | Attending: Nurse Practitioner | Admitting: Physical Therapy

## 2021-06-27 DIAGNOSIS — Z7409 Other reduced mobility: Secondary | ICD-10-CM | POA: Insufficient documentation

## 2021-06-27 DIAGNOSIS — M6281 Muscle weakness (generalized): Secondary | ICD-10-CM | POA: Insufficient documentation

## 2021-06-27 DIAGNOSIS — U099 Post covid-19 condition, unspecified: Secondary | ICD-10-CM | POA: Diagnosis not present

## 2021-06-27 NOTE — Therapy (Signed)
[facility] [address] [place], [place], [address] Phone: [phone]   Fax:  [phone]  Physical Therapy Treatment  Patient Details  Name: Vanessa Chapman MRN: [id number] Date of Birth: 01/30/1971 Referring Provider (PT): Angus Seller, NP   Encounter Date: 06/27/2021   PT End of Session - 06/27/21 0815     Visit Number 5    Number of Visits 12    Date for PT Re-Evaluation 07/16/21    Authorization Type BCBS    PT Start Time 0800    PT Stop Time 0900    PT Time Calculation (min) 60 min             Past Medical History:  Diagnosis Date   Allergic asthma, mild intermittent, uncomplicated    Depression    GERD (gastroesophageal reflux disease)    Heart murmur    asymptomatic   Hypertension    Hypothyroidism    Hypothyroidism    Pelvic pain    PONV (postoperative nausea and vomiting)    years ago   Wears contact lenses     Past Surgical History:  Procedure Laterality Date   ABDOMINAL HYSTERECTOMY     BACK SURGERY     BALLOON SINUPLASTY  10/2015   BREAST ENHANCEMENT SURGERY  2008   BREAST SURGERY     CESAREAN SECTION  1996   ENDOMETRIAL ABLATION W/ NOVASURE  01/2015   ESOPHAGOGASTRODUODENOSCOPY (EGD) WITH PROPOFOL N/A 09/28/2018   Procedure: ESOPHAGOGASTRODUODENOSCOPY (EGD) WITH PROPOFOL;  Surgeon: Toney Reil, MD;  Location: [facility];  Service: Gastroenterology;  Laterality: N/A;   EXCISIONAL HEMORRHOIDECTOMY  09/28/2006   LAPAROSCOPIC VAGINAL HYSTERECTOMY WITH SALPINGECTOMY Bilateral 11/18/2016   Procedure: ATTEMPTED LAPAROSCOPIC ASSISTED VAGINAL HYSTERECTOMY WITH SALPINGECTOMY: LYSIS OF ADHESIONS ABDOMINAL HYSTERECTOMY Elsie Saas SALPIGECTOMY;  Surgeon: Richarda Overlie, MD;  Location: [facility];  Service: Gynecology;  Laterality: Bilateral;   LUMBAR LAMINECTOMY  12/05/2008   L4 -- L5   MICROTUBOPLASTY  2009   tubal ligation reversal   ROBOTIC ASSISTED LAPAROSCOPIC  CHOLECYSTECTOMY-MULTI SITE N/A 10/22/2018   Procedure: ROBOTIC ASSISTED LAPAROSCOPIC CHOLECYSTECTOMY-MULTI SITE;  Surgeon: Leafy Ro, MD;  Location: [facility];  Service: General;  Laterality: N/A;   TUBAL LIGATION Bilateral 1998    There were no vitals filed for this visit.   Subjective Assessment - 06/27/21 0800     Subjective I had really bad back pain and had to cancel my last visit. Also cough and congestion is intermittent. Lower back is tight , upper back and chest are a little tight too and was worse last night.I [place] of walking at the beach about half mile at a time. My breathing is better at the beach. Overall I think my shortness of breath, fatigue and endurance are the same.    Pertinent History post covid pericarditis,  asthma,  low thyroid, hypertension, lumbar discectomy, pelvic pain    Currently in Pain? Yes    Pain Score 4     Pain Location Back    Pain Orientation Lower    Pain Descriptors / Indicators Aching;Tightness    Pain Type Chronic pain    Aggravating Factors  positioning, coughing    Pain Relieving Factors pain med    Pain Score 2    Pain Location Chest    Pain Orientation Mid    Pain Descriptors / Indicators Tightness    Pain Radiating Towards upper back    Aggravating Factors  coughing , positions  [facility] Adult PT Treatment/Exercise - 06/27/21 0001       Knee/Hip Exercises: Stretches   Other Knee/Hip Stretches Qped childs pose and cat/ camel with head neutral    Other Knee/Hip Stretches SKTC 3x30 sec each, Figure 4 push pull 3 x 30 sec each, LTR 10 x 10 reps      Knee/Hip Exercises: Supine   Bridges 5 reps    Bridges Limitations increased back pain so discontinued    Other Supine Knee/Hip Exercises pelvic tilit 5 sec x 10    Other Supine Knee/Hip Exercises Abdominal draw in with alternating bent knee fallouts , bent knee raises - all without increased pain      Modalities   Modalities Moist  Heat      Moist Heat Therapy   Number Minutes Moist Heat 15 Minutes    Moist Heat Location Lumbar Spine   seated per pt preference                        PT Long Term Goals - 06/04/21 0900       PT LONG TERM GOAL #1   Title Pt will be I with HEP for endurance, overall strength upon discharge    Time 6    Period Weeks    Status New    Target Date 07/16/21      PT LONG TERM GOAL #2   Title Pt will be able to walk (with dog)  15-20 min with no excessive dyspnea ( < 2/10 on Mod Borg)    Time 6    Period Weeks    Status New    Target Date 07/16/21      PT LONG TERM GOAL #3   Title Pt will be able to complete ADLs with dyspnea (1/10 or less)    Time 6    Period Weeks    Status New    Target Date 07/16/21      PT LONG TERM GOAL #4   Title Pt will be able to reduce pain using positioning, stretching as a daily routine to improve her ability to work and complete ADLs, IADLs    Time 6    Period Weeks    Status New    Target Date 07/16/21      PT LONG TERM GOAL #5   Title FOTO score will improve to 57% able to demo functional mobility  improvement    Time 6    Period Weeks    Status New    Target Date 07/16/21                   Plan - 06/27/21 0846     Clinical Impression Statement Pt arrives reporting lower back pain has signifiantly increased since beginning PT exercises for Post COVID. She has tightness and catching in lower back limiting her tolerance to ADLs. Pt was asked to discontinue current HEP and was given general stretches to attempt to calm down back pain. MRI of lumbar was ordered in June but was unable to be completed due to insurance. Asked [name] to get referral for LBP so we can treat her and hopefully get her MRI approved. As far as Post covid diagnosis, she feels she is about the same. Trial of HMP at end of session to decrease tightness. Afterward she reported her back feeeling much better.    PT Next Visit Plan Did she call MD for  lumbar referral? CHECK GOALS<  HEP,  endurance, . monitor BP, HR and Sa o2. sit to stands, bridge, step up intervals, watch SPO2 with positional changes, continue adding UE movements to therex/ intervals, consider walking while talking,    PT Home Exercise Plan [email or web address]- bridges, step ups, sit-stands (30 sec rest between sets) added heel raise with over head motion.(HOLD ALL HEP FOR NOW) Begin SKTC, LTR and cat/cow to childs pose, increase heat applications at home, walk as able             Patient will benefit from skilled therapeutic intervention in order to improve the following deficits and impairments:  Cardiopulmonary status limiting activity, Decreased activity tolerance, Decreased strength, Increased fascial restricitons, Impaired flexibility, Impaired UE functional use, Postural dysfunction, Pain, Decreased mobility, Decreased endurance  Visit Diagnosis: Post-COVID syndrome  Post-COVID chronic decreased mobility and endurance  Muscle weakness (generalized)     Problem List Patient Active Problem List   Diagnosis Date Noted   Biliary dyskinesia    Mild intermittent asthma without complication 04/06/2018   Seasonal allergic rhinitis due to pollen 04/06/2018   Chronic pansinusitis 04/06/2018   Chronic pelvic pain in female 01/08/2018   Chronic pain syndrome 07/23/2017   Left lower quadrant pain 05/27/2017   Pelvic pain 11/18/2016    Vanessa Chapman, PTA 06/27/2021, 9:27 AM  [facility] Out[facility] Rehabilitation [facility] [address] [place], [place], [address] Phone: [phone]   Fax:  [phone]  Name: Vanessa Chapman MRN: [id number] Date of Birth: 04-07-1971

## 2021-07-01 ENCOUNTER — Ambulatory Visit: Payer: BC Managed Care – PPO | Admitting: Physical Therapy

## 2021-07-01 ENCOUNTER — Other Ambulatory Visit: Payer: Self-pay

## 2021-07-01 ENCOUNTER — Encounter: Payer: Self-pay | Admitting: Physical Therapy

## 2021-07-01 DIAGNOSIS — U099 Post covid-19 condition, unspecified: Secondary | ICD-10-CM

## 2021-07-01 DIAGNOSIS — Z7409 Other reduced mobility: Secondary | ICD-10-CM

## 2021-07-01 DIAGNOSIS — M6281 Muscle weakness (generalized): Secondary | ICD-10-CM

## 2021-07-01 NOTE — Therapy (Addendum)
[facility] [place], [place], [address] Phone: [phone]   Fax:  [phone]  Physical Therapy Treatment/Discharge  Patient Details  Name: Vanessa Chapman MRN: [id number] Date of Birth: Nov 04, 1971 Referring Provider (PT): Lazaro Arms, NP   Encounter Date: 07/01/2021   PT End of Session - 07/01/21 1249     Visit Number 6    Number of Visits 12    Date for PT Re-Evaluation 07/16/21    Authorization Type BCBS    PT Start Time 0850    PT Stop Time 0945    PT Time Calculation (min) 55 min    Activity Tolerance Patient limited by fatigue;Patient tolerated treatment well    Behavior During Therapy Anxious;WFL for tasks assessed/performed             Past Medical History:  Diagnosis Date   Allergic asthma, mild intermittent, uncomplicated    Depression    GERD (gastroesophageal reflux disease)    Heart murmur    asymptomatic   Hypertension    Hypothyroidism    Hypothyroidism    Pelvic pain    PONV (postoperative nausea and vomiting)    years ago   Wears contact lenses     Past Surgical History:  Procedure Laterality Date   ABDOMINAL HYSTERECTOMY     BACK SURGERY     BALLOON SINUPLASTY  10/2015   BREAST ENHANCEMENT SURGERY  2008   BREAST SURGERY     CESAREAN SECTION  1996   ENDOMETRIAL ABLATION W/ NOVASURE  01/2015   ESOPHAGOGASTRODUODENOSCOPY (EGD) WITH PROPOFOL N/A 09/28/2018   Procedure: ESOPHAGOGASTRODUODENOSCOPY (EGD) WITH PROPOFOL;  Surgeon: Lin Landsman, MD;  Location: [facility];  Service: Gastroenterology;  Laterality: N/A;   EXCISIONAL HEMORRHOIDECTOMY  09/28/2006   LAPAROSCOPIC VAGINAL HYSTERECTOMY WITH SALPINGECTOMY Bilateral 11/18/2016   Procedure: ATTEMPTED LAPAROSCOPIC ASSISTED VAGINAL HYSTERECTOMY WITH SALPINGECTOMY: LYSIS OF ADHESIONS ABDOMINAL HYSTERECTOMY Jerrye Bushy SALPIGECTOMY;  Surgeon: Molli Posey, MD;  Location: [facility];  Service:  Gynecology;  Laterality: Bilateral;   LUMBAR LAMINECTOMY  12/05/2008   L4 -- L5   MICROTUBOPLASTY  2009   tubal ligation reversal   ROBOTIC ASSISTED LAPAROSCOPIC CHOLECYSTECTOMY-MULTI SITE N/A 10/22/2018   Procedure: ROBOTIC ASSISTED LAPAROSCOPIC CHOLECYSTECTOMY-MULTI SITE;  Surgeon: Jules Husbands, MD;  Location: [facility];  Service: General;  Laterality: N/A;   TUBAL LIGATION Bilateral 1998    There were no vitals filed for this visit.   Subjective Assessment - 07/01/21 1239     Subjective She had no pain this AM, did some stretching and her back started hurting.  Still hard to walk longer distances.  the doctor said they would work on getting the referral placed. She states she was at the point yrs ago when she had done injections, was about to get a nerve stimulator placed but "life got away from her" had to care for her parents as they needed. This has just made it worse.    Currently in Pain? Yes    Pain Score 5     Pain Location Back    Pain Orientation Lower;Right;Left    Pain Descriptors / Indicators Aching;Tightness    Pain Type Chronic pain    Pain Onset More than a month ago    Pain Frequency Intermittent    Aggravating Factors  stretching it, prolonged sitting or standing    Pain Relieving Factors rest    Effect of Pain on Daily Activities feels drained all the time, energy, painful to do  basic things                [facility] PT Assessment - 07/01/21 0001       Posture/Postural Control   Posture Comments sits with increased lumbar extension, increased tension in upper body and neck      AROM   Lumbar Flexion pain , touches ankles    Lumbar Extension 50% no pain    Lumbar - Right Side Bend WNL    Lumbar - Left Side Bend WNL    Lumbar - Right Rotation 50%    Lumbar - Left Rotation 50%      Palpation   Palpation comment lumbar paraspinals hypertonic                [facility] Adult PT Treatment/Exercise - 07/01/21 0001       Lumbar Exercises: Supine   Ab  Set 5 reps    Pelvic Tilt 10 reps    Clam 10 reps    Heel Slides 10 reps    Bent Knee Raise 10 reps    Bridge 10 reps    Bridge Limitations articulating with partial ROM      Lumbar Exercises: Quadruped   Madcat/Old Horse 5 reps    Opposite Arm/Leg Raise Left arm/Right leg;Right arm/Left leg    Opposite Arm/Leg Raise Limitations pain in LLE x 2    Other Quadruped Lumbar Exercises childs pose    Other Quadruped Lumbar Exercises cat and camel with Reformer, and press out of carriage with 1 red spring      Cryotherapy   Number Minutes Cryotherapy 15 Minutes    Cryotherapy Location Lumbar Spine    Type of Cryotherapy Ice pack      Electrical Stimulation   Electrical Stimulation Location Lumbar    Electrical Stimulation Action IFC    Electrical Stimulation Parameters To tolerance    Electrical Stimulation Goals Pain                    PT Education - 07/01/21 1248     Education Details stabilization, HEP    Person(s) Educated Patient    Methods Explanation;Handout    Comprehension Verbalized understanding;Returned demonstration;Verbal cues required                 PT Long Term Goals - 07/01/21 1249       PT LONG TERM GOAL #1   Title Pt will be I with HEP for endurance, overall strength upon discharge    Status On-going      PT LONG TERM GOAL #2   Title Pt will be able to walk (with dog)  15-20 min with no excessive dyspnea ( < 2/10 on [employer])    Status Achieved      PT LONG TERM GOAL #3   Title Pt will be able to complete ADLs with dyspnea (1/10 or less)    Status On-going      PT LONG TERM GOAL #4   Title Pt will be able to reduce pain using positioning, stretching as a daily routine to improve her ability to work and complete ADLs, IADLs    Baseline takes frequent breaks to stand and walk around while working, as best as she can    Status On-going      PT LONG TERM GOAL #5   Title FOTO score will improve to 57% able to demo functional mobility   improvement    Status Unable to assess  Additional Long Term Goals   Additional Long Term Goals Yes      PT LONG TERM GOAL #6   Title Pt will be able to return to HEP routine for strength and conditioning without exacerbation of pain    Time 4    Period Weeks    Status New    Target Date 08/15/21                   Plan - 07/01/21 1250     Clinical Impression Statement Patient seen to assess lumbar spine and begin a core stabilzation routine to see if that can reduce her pain .  Stretches do not make a difference for her pain so perhaps what she needs is stabilization training. Symptoms are not consistent with a direction preference and she does not appear to have overt weakness or instability.  This chronic issue is flared due to an increase in activity with PT for post COVID.  Trial of IFC with ice to see if that will be an option for her at home. Will focus on spine mobility and core /lower abs and build gradually back to be able to tolerate standing activity.    PT Treatment/Interventions ADLs/Self Care Home Management;Cryotherapy;Moist Heat;Functional mobility training;Therapeutic activities;Therapeutic exercise;Patient/family education;Manual techniques;Taping    PT Next Visit Plan lumbar referral?  CHECK GOALS<  HEP, endurance, . monitor BP, HR and Sa o2. sit to stands, bridge, step up intervals, watch SPO2 with positional changes, continue adding UE movements to therex/ intervals, consider walking while talking,    PT Home Exercise Plan [id number]- bridges, step ups, sit-stands (30 sec rest between sets) added heel raise with over head motion.(HOLD ALL HEP FOR NOW) Begin SKTC, LTR and cat/cow to childs pose, increase heat applications at home, walk as able. Lumbar stab I    Consulted and Agree with Plan of Care Patient             Patient will benefit from skilled therapeutic intervention in order to improve the following deficits and impairments:  Cardiopulmonary  status limiting activity, Decreased activity tolerance, Decreased strength, Increased fascial restricitons, Impaired flexibility, Impaired UE functional use, Postural dysfunction, Pain, Decreased mobility, Decreased endurance  Visit Diagnosis: Post-COVID syndrome  Post-COVID chronic decreased mobility and endurance  Muscle weakness (generalized)     Problem List Patient Active Problem List   Diagnosis Date Noted   Biliary dyskinesia    Mild intermittent asthma without complication 45/62/5638   Seasonal allergic rhinitis due to pollen 04/06/2018   Chronic pansinusitis 04/06/2018   Chronic pelvic pain in female 01/08/2018   Chronic pain syndrome 07/23/2017   Left lower quadrant pain 05/27/2017   Pelvic pain 11/18/2016    Rutherford Alarie 07/01/2021, 4:49 PM  [facility] [address] [place], [place], [address] Phone: [phone]   Fax:  [phone]  Name: Glennys Schorsch MRN: [id number] Date of Birth: 12-Dec-1971   Raeford Razor, PT 07/01/21 4:50 PM Phone: [phone] Fax: [phone]   PHYSICAL THERAPY DISCHARGE SUMMARY  Visits from Start of Care: 6  Current functional level related to goals / functional outcomes: See above    Remaining deficits: Unknown at this time    Education / Equipment: HEP    Patient agrees to discharge. Patient goals were not met. Patient is being discharged due to not returning since the last visit.  Raeford Razor, PT 08/23/21 11:23 AM Phone: [phone] Fax: [phone]

## 2021-07-04 ENCOUNTER — Ambulatory Visit: Payer: BC Managed Care – PPO | Admitting: Physical Therapy

## 2021-07-11 ENCOUNTER — Telehealth: Payer: Self-pay

## 2021-07-11 NOTE — Telephone Encounter (Signed)
LMOM to confirm if she is still out pt, and if so to make appt, may need mammogram

## 2021-07-12 ENCOUNTER — Ambulatory Visit: Payer: BC Managed Care – PPO | Admitting: Physical Therapy

## 2021-07-15 ENCOUNTER — Ambulatory Visit: Payer: BC Managed Care – PPO | Admitting: Physical Therapy

## 2022-01-13 ENCOUNTER — Other Ambulatory Visit: Payer: Self-pay | Admitting: Obstetrics and Gynecology

## 2022-01-13 DIAGNOSIS — Z1231 Encounter for screening mammogram for malignant neoplasm of breast: Secondary | ICD-10-CM

## 2022-01-16 ENCOUNTER — Ambulatory Visit
Admission: RE | Admit: 2022-01-16 | Discharge: 2022-01-16 | Disposition: A | Discharge status: Home / Self Care | Payer: BC Managed Care – PPO | Source: Ambulatory Visit | Attending: Obstetrics and Gynecology | Admitting: Obstetrics and Gynecology

## 2022-01-16 DIAGNOSIS — Z1231 Encounter for screening mammogram for malignant neoplasm of breast: Secondary | ICD-10-CM

## 2022-01-17 ENCOUNTER — Emergency Department: Payer: BC Managed Care – PPO

## 2022-01-17 ENCOUNTER — Other Ambulatory Visit: Payer: Self-pay

## 2022-01-17 ENCOUNTER — Encounter: Payer: Self-pay | Admitting: Emergency Medicine

## 2022-01-17 ENCOUNTER — Emergency Department
Admission: EM | Admit: 2022-01-17 | Discharge: 2022-01-17 | Disposition: A | Discharge status: Home / Self Care | Payer: BC Managed Care – PPO | Attending: Emergency Medicine | Admitting: Emergency Medicine

## 2022-01-17 DIAGNOSIS — J45909 Unspecified asthma, uncomplicated: Secondary | ICD-10-CM | POA: Diagnosis not present

## 2022-01-17 DIAGNOSIS — E876 Hypokalemia: Secondary | ICD-10-CM | POA: Insufficient documentation

## 2022-01-17 DIAGNOSIS — R1013 Epigastric pain: Secondary | ICD-10-CM | POA: Insufficient documentation

## 2022-01-17 DIAGNOSIS — R1011 Right upper quadrant pain: Secondary | ICD-10-CM | POA: Diagnosis present

## 2022-01-17 DIAGNOSIS — D72829 Elevated white blood cell count, unspecified: Secondary | ICD-10-CM | POA: Diagnosis not present

## 2022-01-17 DIAGNOSIS — R1010 Upper abdominal pain, unspecified: Secondary | ICD-10-CM

## 2022-01-17 DIAGNOSIS — R079 Chest pain, unspecified: Secondary | ICD-10-CM | POA: Diagnosis not present

## 2022-01-17 LAB — CBC
HCT: 45.3 % (ref 36.0–46.0)
Hemoglobin: 15.3 g/dL — ABNORMAL HIGH (ref 12.0–15.0)
MCH: 32.8 pg (ref 26.0–34.0)
MCHC: 33.8 g/dL (ref 30.0–36.0)
MCV: 97 fL (ref 80.0–100.0)
Platelets: 312 10*3/uL (ref 150–400)
RBC: 4.67 MIL/uL (ref 3.87–5.11)
RDW: 12 % (ref 11.5–15.5)
WBC: 13.3 10*3/uL — ABNORMAL HIGH (ref 4.0–10.5)
nRBC: 0 % (ref 0.0–0.2)

## 2022-01-17 LAB — URINALYSIS, ROUTINE W REFLEX MICROSCOPIC
Bilirubin Urine: NEGATIVE
Glucose, UA: NEGATIVE mg/dL
Hgb urine dipstick: NEGATIVE
Ketones, ur: NEGATIVE mg/dL
Leukocytes,Ua: NEGATIVE
Nitrite: NEGATIVE
Protein, ur: NEGATIVE mg/dL
Specific Gravity, Urine: 1.01 (ref 1.005–1.030)
pH: 7 (ref 5.0–8.0)

## 2022-01-17 LAB — COMPREHENSIVE METABOLIC PANEL
ALT: 32 U/L (ref 0–44)
AST: 26 U/L (ref 15–41)
Albumin: 4.4 g/dL (ref 3.5–5.0)
Alkaline Phosphatase: 53 U/L (ref 38–126)
Anion gap: 10 (ref 5–15)
BUN: 17 mg/dL (ref 6–20)
CO2: 33 mmol/L — ABNORMAL HIGH (ref 22–32)
Calcium: 9.4 mg/dL (ref 8.9–10.3)
Chloride: 100 mmol/L (ref 98–111)
Creatinine, Ser: 0.97 mg/dL (ref 0.44–1.00)
GFR, Estimated: >60 mL/min (ref >60)
Glucose, Bld: 99 mg/dL (ref 70–99)
Potassium: 3.3 mmol/L — ABNORMAL LOW (ref 3.5–5.1)
Sodium: 143 mmol/L (ref 135–145)
Total Bilirubin: 0.6 mg/dL (ref 0.3–1.2)
Total Protein: 7.6 g/dL (ref 6.5–8.1)

## 2022-01-17 LAB — LIPASE, BLOOD: Lipase: 70 U/L — ABNORMAL HIGH (ref 11–51)

## 2022-01-17 LAB — TROPONIN I (HIGH SENSITIVITY)
Troponin I (High Sensitivity): 7 ng/L (ref <18)
Troponin I (High Sensitivity): 6 ng/L (ref <18)

## 2022-01-17 LAB — POC URINE PREG, ED: Preg Test, Ur: NEGATIVE

## 2022-01-17 MED ORDER — FENTANYL CITRATE PF 50 MCG/ML IJ SOSY
50.0000 ug | PREFILLED_SYRINGE | Freq: Once | INTRAMUSCULAR | Status: AC
  Administered 2022-01-17: 50 ug via INTRAVENOUS
  Filled 2022-01-17: qty 1

## 2022-01-17 MED ORDER — OXYCODONE HCL 5 MG PO TABS: 5.0000 mg | ORAL_TABLET | Freq: Three times a day (TID) | ORAL | 0 refills | Status: AC | PRN

## 2022-01-17 MED ORDER — IOHEXOL 300 MG/ML  SOLN
100.0000 mL | Freq: Once | INTRAMUSCULAR | Status: AC | PRN
  Administered 2022-01-17: 100 mL via INTRAVENOUS
  Filled 2022-01-17: qty 100

## 2022-01-17 MED ORDER — SODIUM CHLORIDE 0.9 % IV BOLUS
1000.0000 mL | Freq: Once | INTRAVENOUS | Status: AC
  Administered 2022-01-17: 1000 mL via INTRAVENOUS

## 2022-01-17 MED ORDER — SUCRALFATE 1 G PO TABS
1.0000 g | ORAL_TABLET | Freq: Four times a day (QID) | ORAL | 11 refills | Status: AC
Start: 2022-01-17 — End: 2023-01-17

## 2022-01-17 MED ORDER — POTASSIUM CHLORIDE CRYS ER 20 MEQ PO TBCR
40.0000 meq | EXTENDED_RELEASE_TABLET | Freq: Once | ORAL | Status: AC
Start: 2022-01-17 — End: 2022-01-17
  Administered 2022-01-17: 40 meq via ORAL
  Filled 2022-01-17: qty 2

## 2022-01-17 MED ORDER — ONDANSETRON 4 MG PO TBDP
4.0000 mg | ORAL_TABLET | Freq: Once | ORAL | Status: AC | PRN
  Administered 2022-01-17: 4 mg via ORAL
  Filled 2022-01-17: qty 1

## 2022-01-17 NOTE — Discharge Instructions (Addendum)
-  Please take your medications as prescribed.  Use oxycodone sparingly -Continue taking your pantoprazole daily. -Follow-up with the gastroenterologist listed above as needed. -Recommend avoiding spicy foods or fatty foods.

## 2022-01-17 NOTE — ED Provider Notes (Signed)
[facility] Provider Note    Event Date/Time   First MD Initiated Contact with Patient 01/17/22 1139     (approximate)   History   Chief Complaint Abdominal Pain and Chest Pain   HPI Vanessa Chapman is a 51 y.o. female, history of chronic pain syndrome, intermittent asthma, long COVID, pericarditis, cholecystectomy, presents to the emergency department for evaluation of abdominal pain and chest pain.  Patient states that her pain began last night and has continued to worsen.  She states that the pain is mostly in her right upper quadrant extending into her epigastric region and centralized chest region.  Described as a constant 5/10 pain with intermittent increases while moving to 9/10.  Additionally endorses nausea, no vomiting.    History Limitations: No limitations      Physical Exam  Triage Vital Signs: ED Triage Vitals [01/17/22 1033]  Enc Vitals Group     BP (!) 152/90     Pulse Rate 81     Resp 18     Temp 98.3 F (36.8 C)     Temp Source Oral     SpO2 99 %     Weight 160 lb (72.6 kg)     Height 5\' 3"  (1.6 m)     Head Circumference      Peak Flow      Pain Score 9     Pain Loc      Pain Edu?      Excl. in GC?     Most recent vital signs: Vitals:   01/17/22 1033 01/17/22 1521  BP: (!) 152/90 (!) 148/86  Pulse: 81 77  Resp: 18 18  Temp: 98.3 F (36.8 C)   SpO2: 99% 99%    General: Awake, appears uncomfortable. CV: Good peripheral perfusion.  Resp: Normal effort.  Lung sounds clear bilaterally in the apices and bases. Abd: Soft, no distention.  Moderate tenderness when palpating right upper quadrant and epigastric region. Neuro: At baseline. No gross neurological deficits. Other: No tenderness when palpating the chest wall  Physical Exam    ED Results / Procedures / Treatments  Labs (all labs ordered are listed, but only abnormal results are displayed) Labs Reviewed  LIPASE, BLOOD - Abnormal; Notable for the  following components:      Result Value   Lipase 70 (*)    All other components within normal limits  COMPREHENSIVE METABOLIC PANEL - Abnormal; Notable for the following components:   Potassium 3.3 (*)    CO2 33 (*)    All other components within normal limits  CBC - Abnormal; Notable for the following components:   WBC 13.3 (*)    Hemoglobin 15.3 (*)    All other components within normal limits  URINALYSIS, ROUTINE W REFLEX MICROSCOPIC  POC URINE PREG, ED  TROPONIN I (HIGH SENSITIVITY)  TROPONIN I (HIGH SENSITIVITY)     EKG Sinus rhythm, rate of 84, nonspecific T wave abnormalities, though no significant ST segment elevation or depression, no AV blocks, no axis deviations.   RADIOLOGY  ED Provider Interpretation: I personally reviewed and interpreted these images.  Chest x-ray negative for signs of pneumonia or cardiomegaly.  CT abdomen shows no acute process.     PROCEDURES:  Critical Care performed: None.  Procedures    MEDICATIONS ORDERED IN ED: Medications  ondansetron (ZOFRAN-ODT) disintegrating tablet 4 mg (4 mg Oral Given 01/17/22 1043)  fentaNYL (SUBLIMAZE) injection 50 mcg (50 mcg Intravenous Given 01/17/22 1218)  potassium chloride SA (KLOR-CON M) CR tablet 40 mEq (40 mEq Oral Given 01/17/22 1220)  sodium chloride 0.9 % bolus 1,000 mL (0 mLs Intravenous Stopped 01/17/22 1523)  iohexol (OMNIPAQUE) 300 MG/ML solution 100 mL (100 mLs Intravenous Contrast Given 01/17/22 1319)     IMPRESSION / MDM / ASSESSMENT AND PLAN / ED COURSE  I reviewed the triage vital signs and the nursing notes.                              Vanessa Chapman is a 51 y.o. female, history of chronic pain syndrome, intermittent asthma, long COVID, pericarditis, biliary dyskinesia, presents to the emergency department for evaluation of abdominal pain and chest pain.  Patient states that her pain began last night and has continued to worsen.  She states that the pain is mostly in her right  upper quadrant extending into her epigastric region and centralized chest region.  Described as a constant 5/10 pain with intermittent increases while moving to 9/10.  Differential diagnosis includes, but is not limited to, pancreatitis, cholecystitis, cholangitis, small bowel obstruction, ACS, pericarditis, gastritis, peptic ulcers.   ED Course CBC notable for leukocytosis at 13.3.  No anemia.  CMP shows mild hypokalemia at 3.3, otherwise no electrolyte abnormalities.  Will provide oral potassium supplementation.   Pregnancy test negative.  Lipase notably elevated at 70.  Possibly suggestive of acute pancreatitis.  Will order abdominal CT/pelvis  Initial troponin 7.  Second troponin 6.  Unlikely ACS or myocarditis/pericarditis.  Urinalysis negative for evidence of urinary tract infection or dehydration.  Chest x-ray negative for acute cardiopulmonary process.  CT abdomen pelvis shows no acute process, though does show some small hepatic cysts   Assessment/Plan Given the patient's history, physical exam, and work-up thus far, I do not suspect any serious or life-threatening pathology.  Abdominal CT reassuring.  Given her history of cholecystectomy and lack of transaminitis, unlikely gallbladder/liver pathology.  I suspect the patient's symptoms likely related to some form of gastritis or ulcer disease.  Will provide prescription for sucralfate and encouraged her to continue taking daily pantoprazole.  Will provide referral to gastroenterology for follow-up.  Consulted with attending ED physician Dr. Morey Hummingbird, who agrees with plan.  Patient was provided with anticipatory guidance, return precautions, and educational material. Encouraged the patient to return to the emergency department at any time if they begin to experience any new or worsening symptoms.      FINAL CLINICAL IMPRESSION(S) / ED DIAGNOSES   Final diagnoses:  Pain of upper abdomen     Rx / DC Orders   ED Discharge Orders           Ordered    sucralfate (CARAFATE) 1 g tablet  4 times daily        01/17/22 1456    oxyCODONE (ROXICODONE) 5 MG immediate release tablet  Every 8 hours PRN        01/17/22 1456             Note:  This document was prepared using Dragon voice recognition software and may include unintentional dictation errors.   Teodoro Spray, [place] 01/17/22 1841    Vanessa Borger, MD 01/18/22 1153

## 2022-01-17 NOTE — ED Triage Notes (Signed)
Pt here with abd and CP that started last night. Pt states pain is centered going under her right rib. Pt has nausea. Pt in NAD in triage.

## 2023-03-18 ENCOUNTER — Other Ambulatory Visit: Payer: Self-pay | Admitting: Physical Medicine & Rehabilitation

## 2023-03-18 DIAGNOSIS — M5441 Lumbago with sciatica, right side: Secondary | ICD-10-CM

## 2023-03-20 ENCOUNTER — Ambulatory Visit
Admission: RE | Admit: 2023-03-20 | Discharge: 2023-03-20 | Disposition: A | Discharge status: Home / Self Care | Payer: No Typology Code available for payment source | Source: Ambulatory Visit | Attending: Physical Medicine & Rehabilitation | Admitting: Physical Medicine & Rehabilitation

## 2023-03-20 DIAGNOSIS — M5441 Lumbago with sciatica, right side: Secondary | ICD-10-CM
